# Patient Record
Sex: Female | Born: 1940 | Race: White | Hispanic: No | Marital: Single | Smoking: Never smoker
Health system: Southern US, Community
[De-identification: ages and names within clinical notes are randomized; demographics above are authoritative.]

## PROBLEM LIST (undated history)

## (undated) DIAGNOSIS — I770 Arteriovenous fistula, acquired: Secondary | ICD-10-CM

## (undated) DIAGNOSIS — B2700 Gammaherpesviral mononucleosis without complication: Secondary | ICD-10-CM

## (undated) DIAGNOSIS — I10 Essential (primary) hypertension: Secondary | ICD-10-CM

## (undated) DIAGNOSIS — N186 End stage renal disease: Secondary | ICD-10-CM

## (undated) DIAGNOSIS — T8859XA Other complications of anesthesia, initial encounter: Secondary | ICD-10-CM

## (undated) DIAGNOSIS — T8611 Kidney transplant rejection: Secondary | ICD-10-CM

## (undated) DIAGNOSIS — Z9289 Personal history of other medical treatment: Secondary | ICD-10-CM

## (undated) DIAGNOSIS — M1990 Unspecified osteoarthritis, unspecified site: Secondary | ICD-10-CM

## (undated) DIAGNOSIS — N051 Unspecified nephritic syndrome with focal and segmental glomerular lesions: Secondary | ICD-10-CM

## (undated) DIAGNOSIS — D649 Anemia, unspecified: Secondary | ICD-10-CM

## (undated) DIAGNOSIS — M810 Age-related osteoporosis without current pathological fracture: Secondary | ICD-10-CM

## (undated) DIAGNOSIS — E079 Disorder of thyroid, unspecified: Secondary | ICD-10-CM

## (undated) DIAGNOSIS — E119 Type 2 diabetes mellitus without complications: Secondary | ICD-10-CM

## (undated) DIAGNOSIS — Z940 Kidney transplant status: Secondary | ICD-10-CM

## (undated) DIAGNOSIS — T4145XA Adverse effect of unspecified anesthetic, initial encounter: Secondary | ICD-10-CM

## (undated) DIAGNOSIS — F419 Anxiety disorder, unspecified: Secondary | ICD-10-CM

## (undated) DIAGNOSIS — C801 Malignant (primary) neoplasm, unspecified: Secondary | ICD-10-CM

## (undated) HISTORY — DX: Unspecified nephritic syndrome with focal and segmental glomerular lesions: N05.1

## (undated) HISTORY — DX: Age-related osteoporosis without current pathological fracture: M81.0

## (undated) HISTORY — DX: Arteriovenous fistula, acquired: I77.0

## (undated) HISTORY — DX: End stage renal disease: N18.6

## (undated) HISTORY — DX: Essential (primary) hypertension: I10

## (undated) HISTORY — PX: AV FISTULA PLACEMENT: SHX1204

## (undated) HISTORY — DX: Disorder of thyroid, unspecified: E07.9

## (undated) HISTORY — DX: Type 2 diabetes mellitus without complications: E11.9

## (undated) HISTORY — PX: THYROIDECTOMY: SHX17

## (undated) HISTORY — DX: Kidney transplant status: Z94.0

## (undated) HISTORY — DX: Anemia, unspecified: D64.9

## (undated) HISTORY — DX: Gammaherpesviral mononucleosis without complication: B27.00

## (undated) HISTORY — DX: Unspecified osteoarthritis, unspecified site: M19.90

## (undated) HISTORY — DX: Kidney transplant rejection: T86.11

---

## 2001-04-29 ENCOUNTER — Other Ambulatory Visit: Admission: RE | Admit: 2001-04-29 | Discharge: 2001-04-29 | Payer: Self-pay | Admitting: *Deleted

## 2002-05-13 ENCOUNTER — Encounter: Admission: RE | Admit: 2002-05-13 | Discharge: 2002-05-13 | Payer: Self-pay | Admitting: *Deleted

## 2002-05-13 ENCOUNTER — Encounter: Payer: Self-pay | Admitting: *Deleted

## 2002-05-15 ENCOUNTER — Other Ambulatory Visit: Admission: RE | Admit: 2002-05-15 | Discharge: 2002-05-15 | Payer: Self-pay | Admitting: *Deleted

## 2002-06-08 ENCOUNTER — Encounter: Admission: RE | Admit: 2002-06-08 | Discharge: 2002-06-08 | Payer: Self-pay | Admitting: *Deleted

## 2002-06-08 ENCOUNTER — Encounter: Payer: Self-pay | Admitting: *Deleted

## 2003-05-31 ENCOUNTER — Ambulatory Visit (HOSPITAL_COMMUNITY): Admission: RE | Admit: 2003-05-31 | Discharge: 2003-05-31 | Payer: Self-pay | Admitting: Vascular Surgery

## 2003-06-08 ENCOUNTER — Ambulatory Visit (HOSPITAL_COMMUNITY): Admission: RE | Admit: 2003-06-08 | Discharge: 2003-06-08 | Payer: Self-pay | Admitting: Nephrology

## 2003-06-20 ENCOUNTER — Ambulatory Visit (HOSPITAL_COMMUNITY): Admission: RE | Admit: 2003-06-20 | Discharge: 2003-06-20 | Payer: Self-pay | Admitting: Nephrology

## 2003-10-04 ENCOUNTER — Other Ambulatory Visit: Admission: RE | Admit: 2003-10-04 | Discharge: 2003-10-04 | Payer: Self-pay | Admitting: *Deleted

## 2003-12-10 ENCOUNTER — Encounter (INDEPENDENT_AMBULATORY_CARE_PROVIDER_SITE_OTHER): Payer: Self-pay | Admitting: Specialist

## 2003-12-10 ENCOUNTER — Ambulatory Visit (HOSPITAL_COMMUNITY): Admission: RE | Admit: 2003-12-10 | Discharge: 2003-12-10 | Payer: Self-pay | Admitting: *Deleted

## 2004-01-18 ENCOUNTER — Encounter (INDEPENDENT_AMBULATORY_CARE_PROVIDER_SITE_OTHER): Payer: Self-pay | Admitting: Specialist

## 2004-01-18 ENCOUNTER — Observation Stay (HOSPITAL_COMMUNITY): Admission: RE | Admit: 2004-01-18 | Discharge: 2004-01-19 | Payer: Self-pay | Admitting: *Deleted

## 2004-01-30 HISTORY — PX: KIDNEY TRANSPLANT: SHX239

## 2004-11-27 ENCOUNTER — Inpatient Hospital Stay (HOSPITAL_COMMUNITY): Admission: AD | Admit: 2004-11-27 | Discharge: 2004-12-06 | Payer: Self-pay | Admitting: Nephrology

## 2004-12-01 ENCOUNTER — Encounter (INDEPENDENT_AMBULATORY_CARE_PROVIDER_SITE_OTHER): Payer: Self-pay | Admitting: *Deleted

## 2004-12-01 ENCOUNTER — Ambulatory Visit: Payer: Self-pay | Admitting: Internal Medicine

## 2005-07-23 IMAGING — CR DG CHEST 2V
2 series · 2 of 2 positions shown · non-contrast
Comparison: none

CLINICAL DATA: H?rthle cell neoplasm.  Cough.
 TWO VIEW CHEST RADIOGRAPH   - 01/18/04 
 No prior studies.

[view not recorded (1 of 2)]
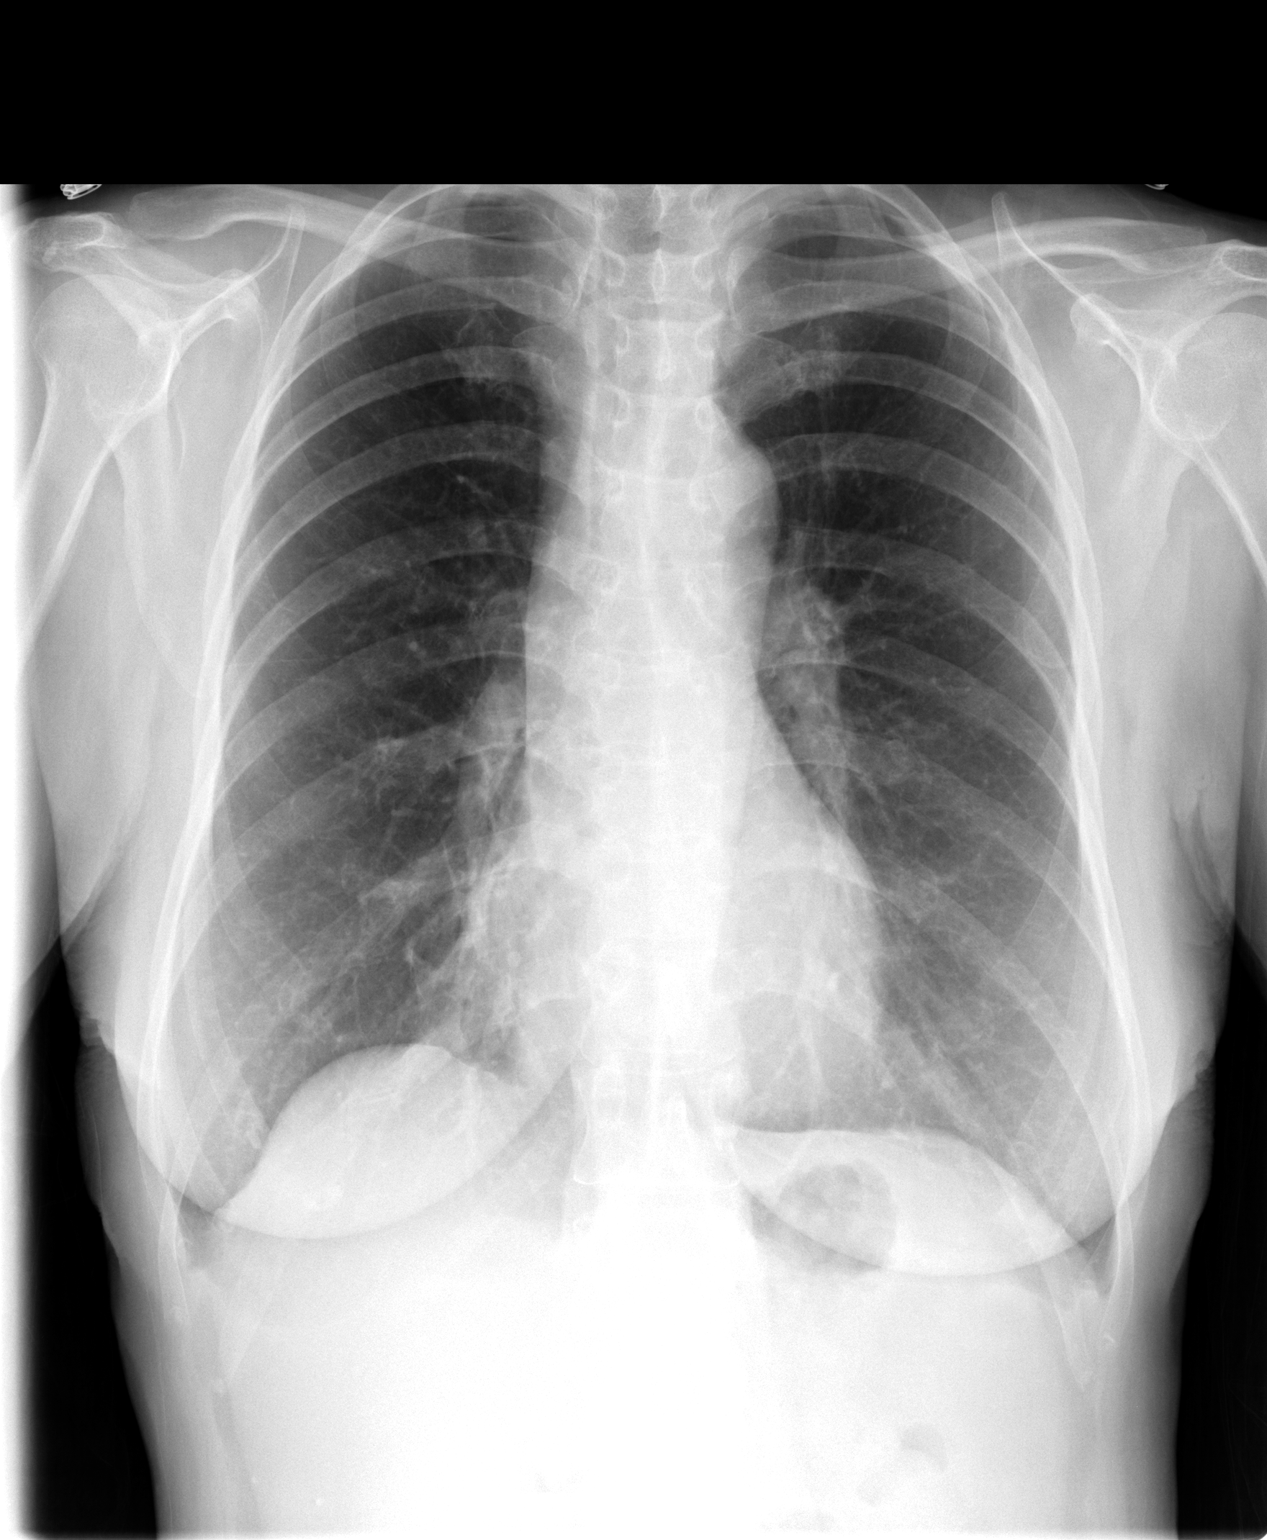

[view not recorded (2 of 2)]
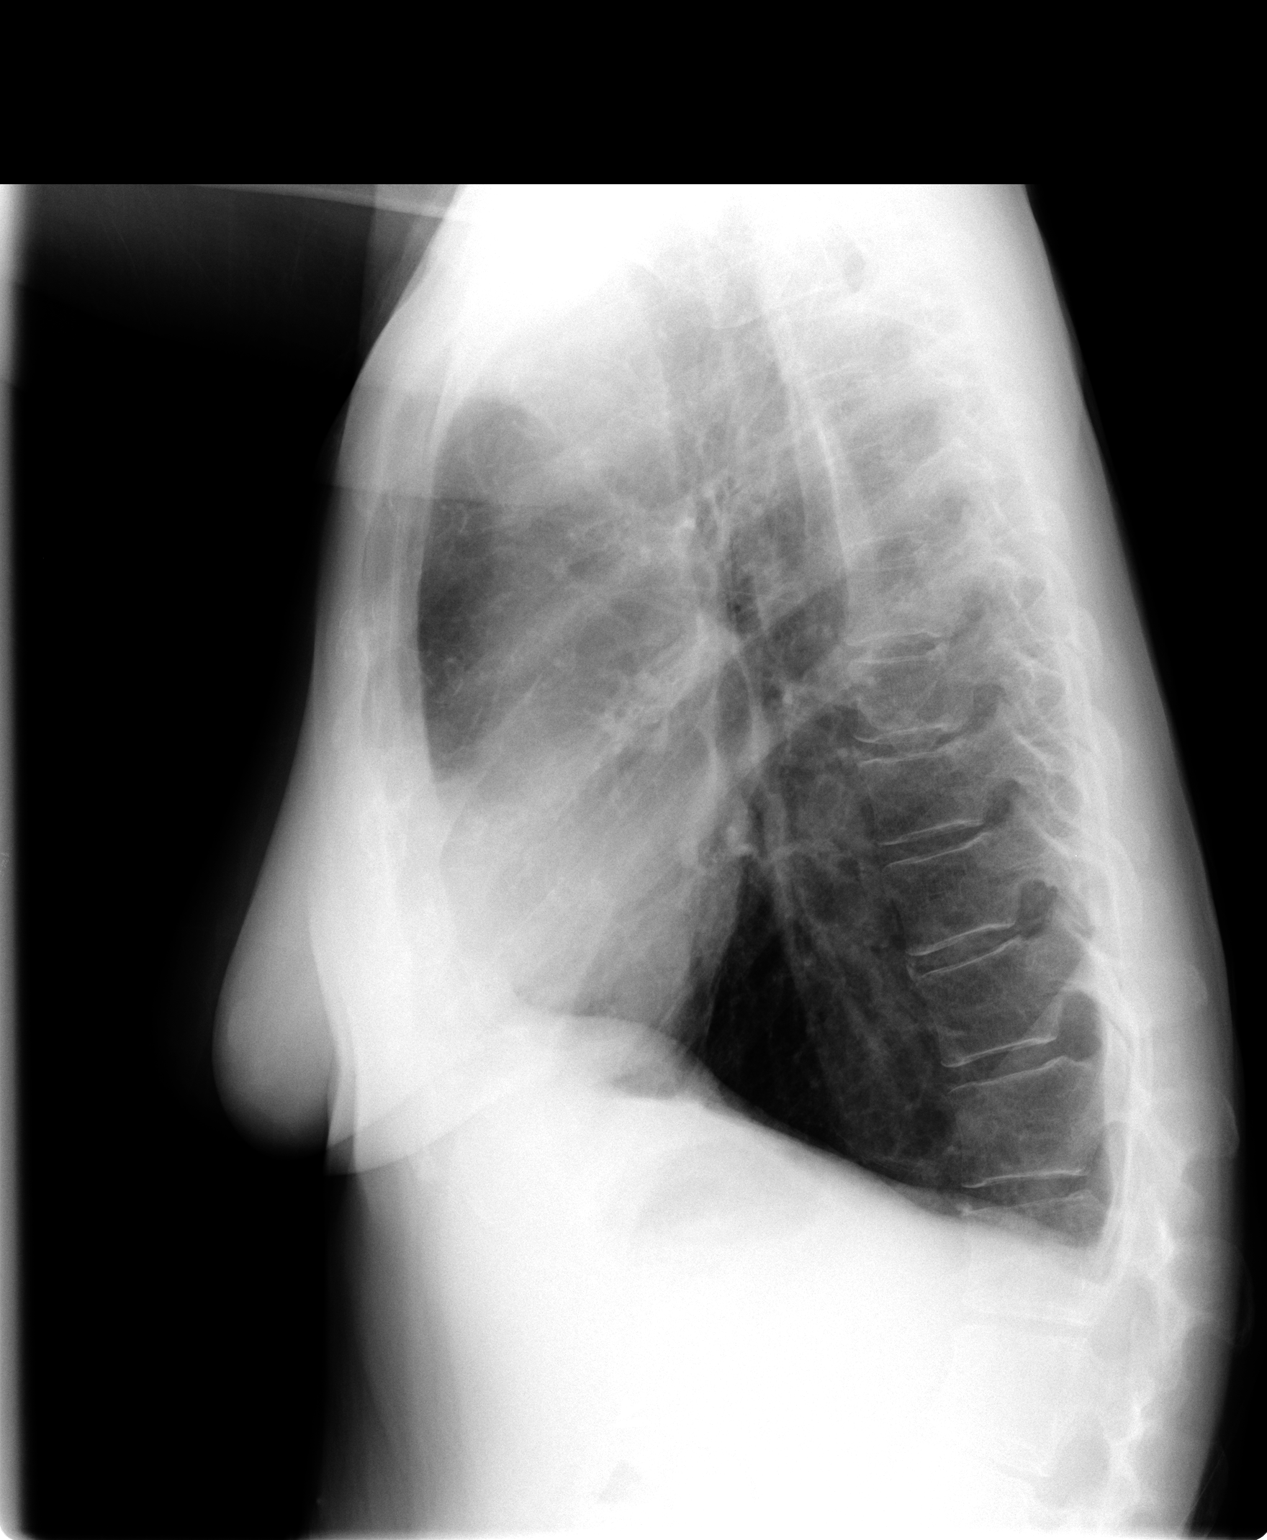

[2 of 2 positions shown; findings below may reference images not displayed]

FINDINGS: No definite goiter is noted.  There is flattening of the hemidiaphragms and tapering of the peripheral pulmonary vasculature suggesting emphysema. The heart and mediastinum appear unremarkable.  The lungs appear clear.
 IMPRESSION
 1.  COPD.

## 2006-06-03 IMAGING — US US ABDOMEN COMPLETE
1 series · 14 of 25 positions shown · non-contrast
Comparison: None

CLINICAL DATA: Status post renal transplant, question perinephric fluid. Status
post cholecystectomy

ABDOMEN ULTRASOUND
TECHNIQUE: Complete abdominal ultrasound examination was performed including
evaluation of the liver, gallbladder, bile ducts, pancreas, kidneys, spleen,
IVC, and abdominal aorta.

[Series 1: unknown · 0.28mm/px · 14 of 56 slices shown]
[im 1/56]
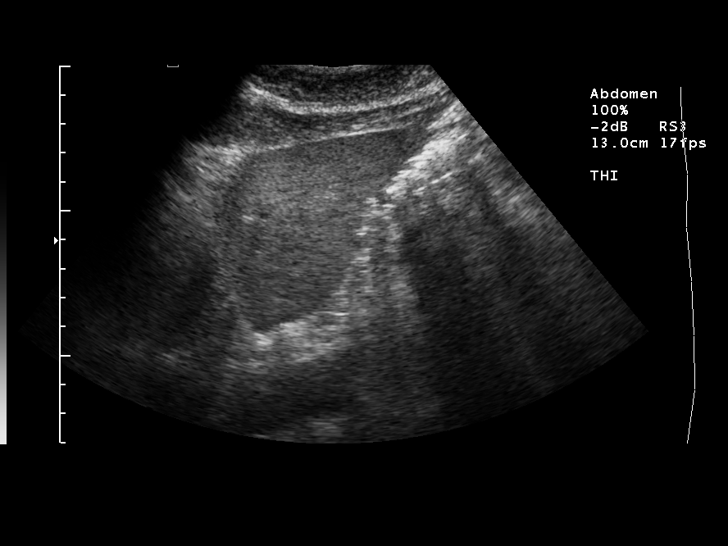
[im 5/56]
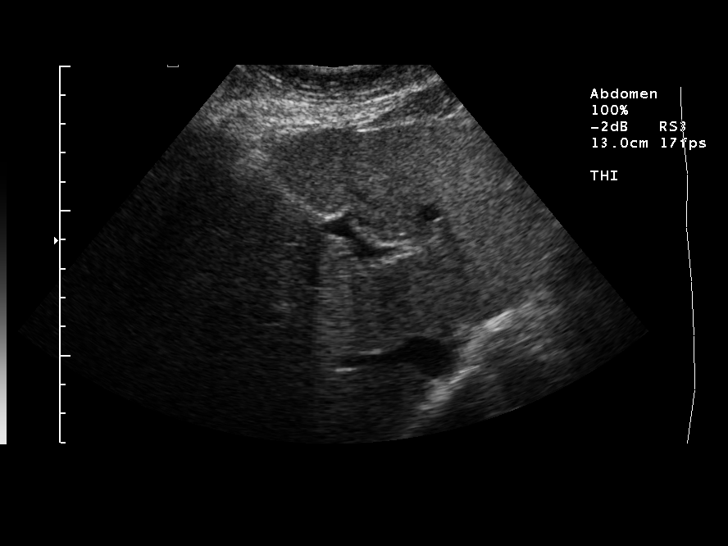
[im 10/56]
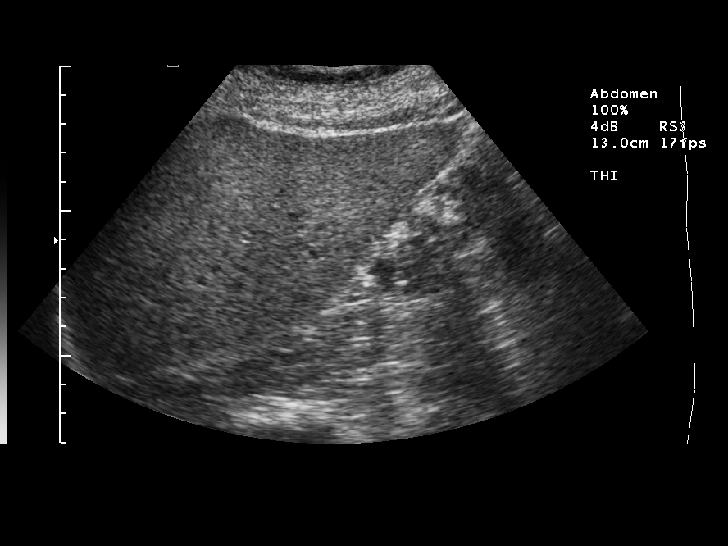
[im 14/56]
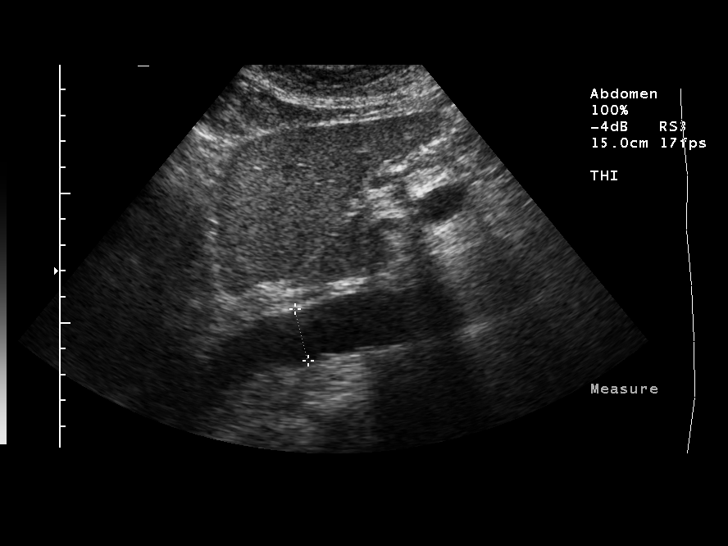
[im 19/56]
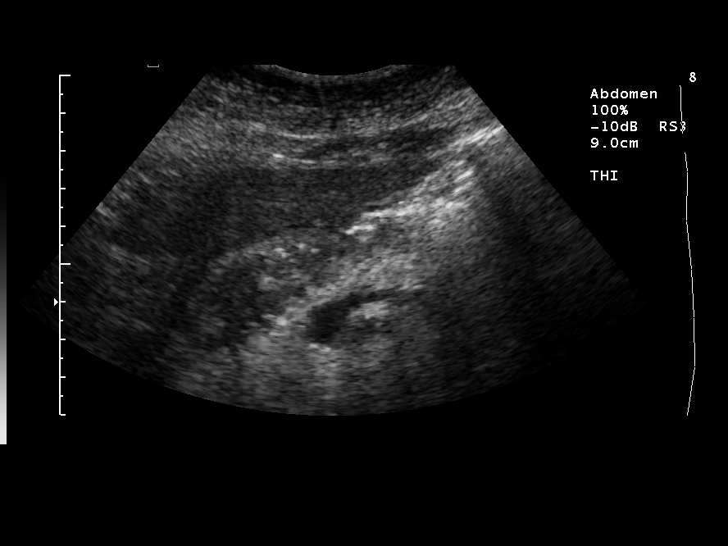
[im 21/56]
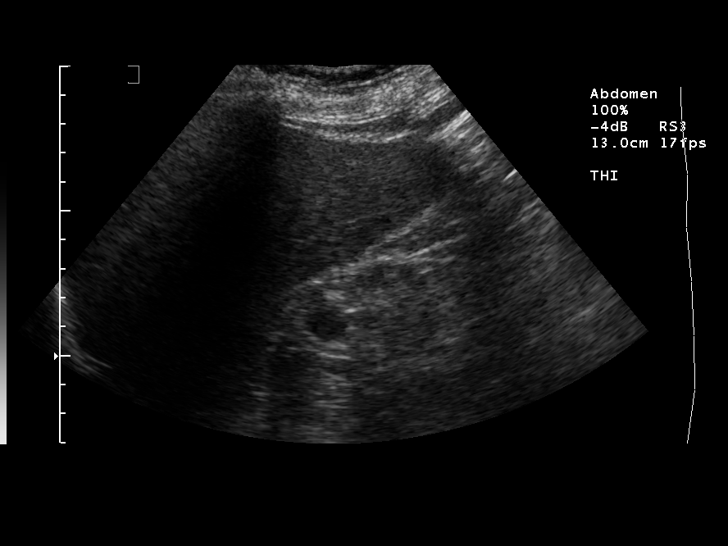
[im 26/56]
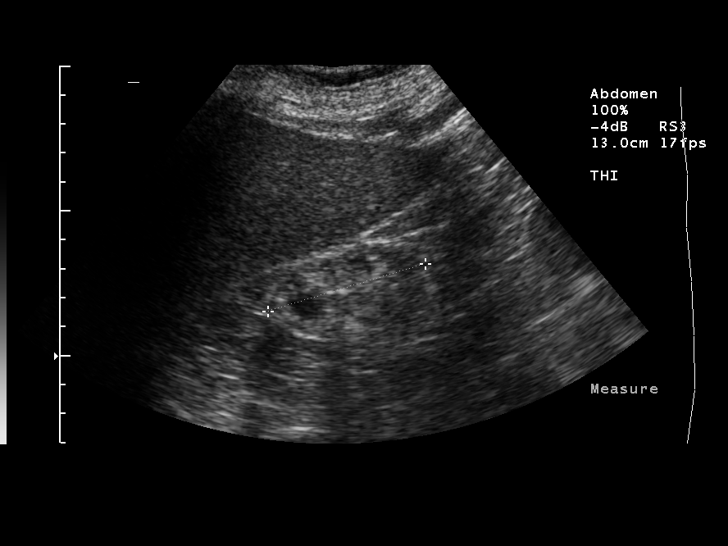
[im 30/56]
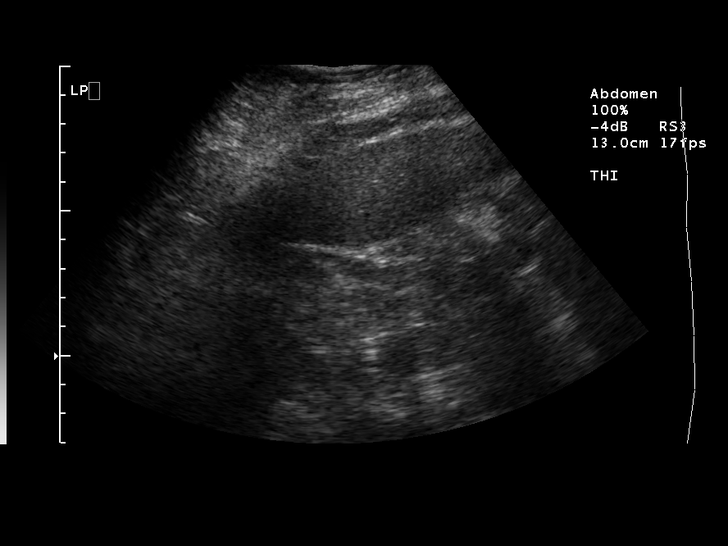
[im 35/56]
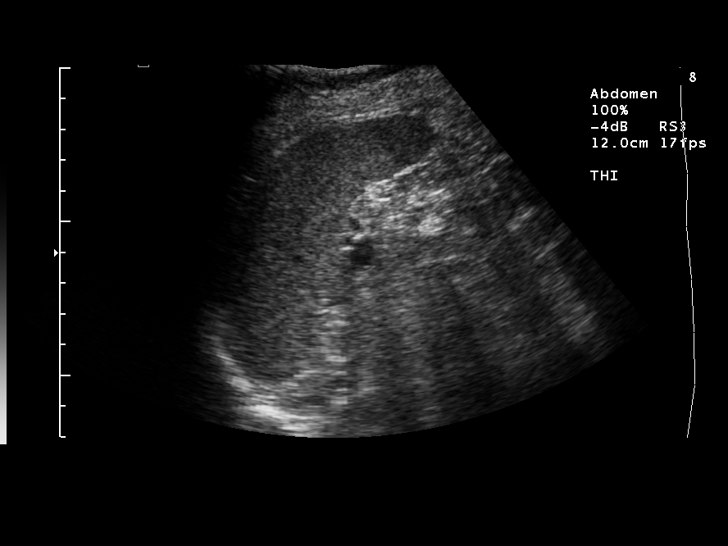
[im 37/56]
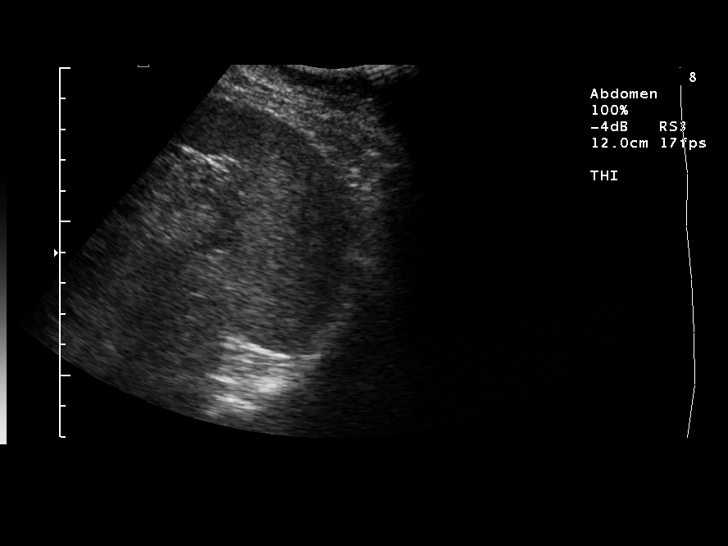
[im 42/56]
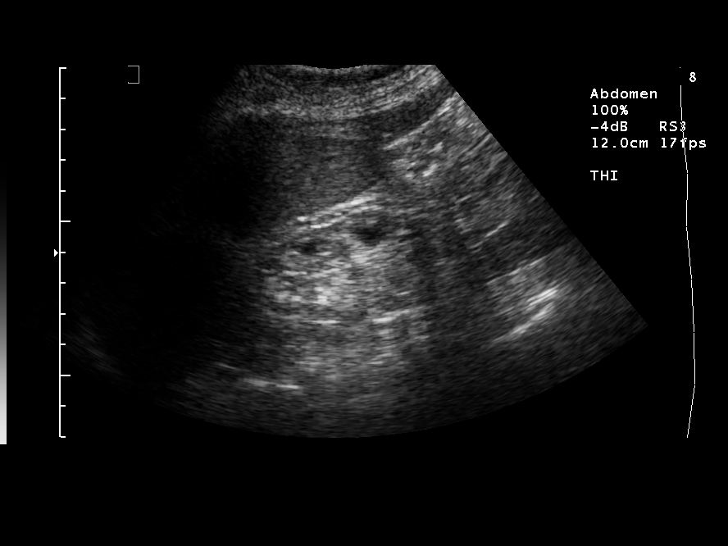
[im 46/56]
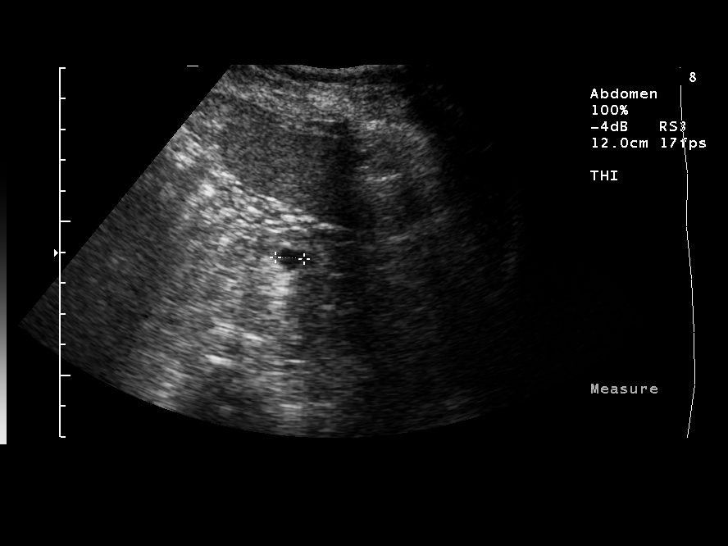
[im 51/56]
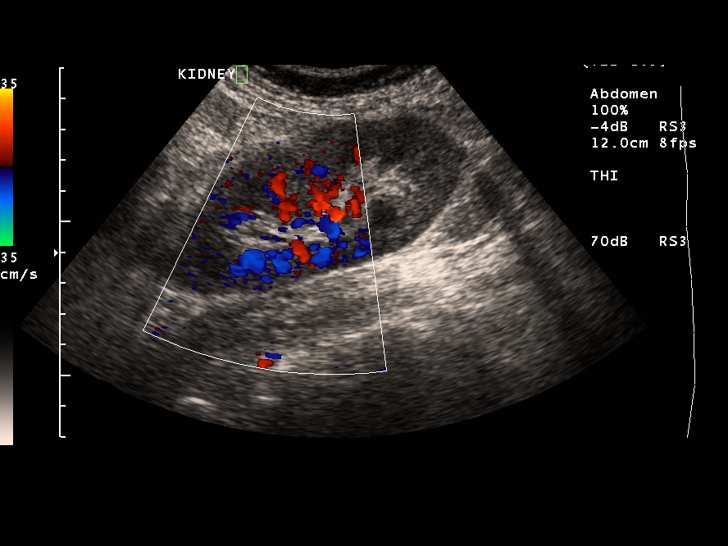
[im 56/56]
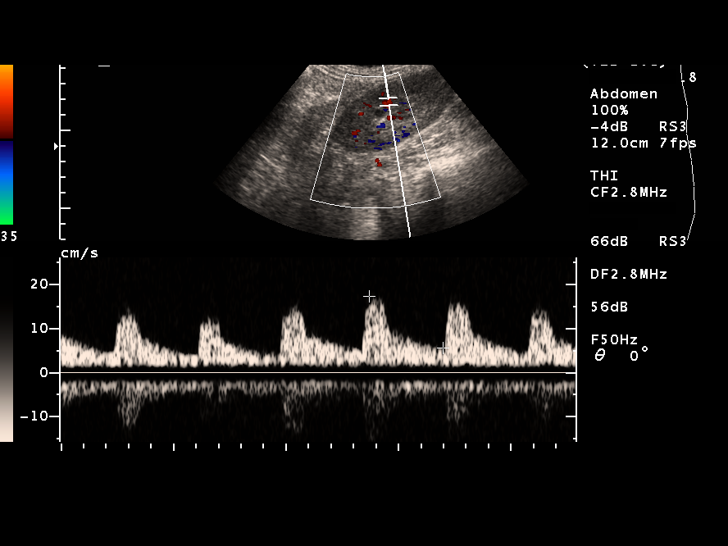

[14 of 25 positions shown; findings below may reference images not displayed]

FINDINGS: Patient status post cholecystectomy. Mild prominence of the common
bile duct at 7 mm, compatible with post cholecystectomy state. Kidneys are small
and echogenic with benign appearing cysts, compatible with patient's history of
end-stage renal disease. The renal transplant is noted in the right lower
quadrant measuring 11.1 cm. No hydronephrosis or perinephric fluid collection.
Resistive index normal at 0.68.

Liver, pancreas, spleen, IVC, and abdominal aorta unremarkable.

IMPRESSION

Unremarkable right lower quadrant renal transplant.

Status post cholecystectomy. 

No acute findings

## 2006-06-04 IMAGING — CT CT ABDOMEN W/O CM
1 series · 15 of 32 positions shown, 19 images · IV contrast (OMNIPAQUE 350)
Comparison: None.

CLINICAL DATA: Dehydration. Nausea, vomiting, diarrhea. Anorexia. History of
kidney transplant.
TECHNIQUE: Multidetector CT imaging of the abdomen and pelvis was performed
following the standard protocol without IV contrast.  Oral contrast was
administered.

[Series 2: routine abdomen · axial · 0.79mm/px · z∈[-479,-59]mm · 15 of 95 slices shown, 19 images]
[im 7/95  soft-tissue]
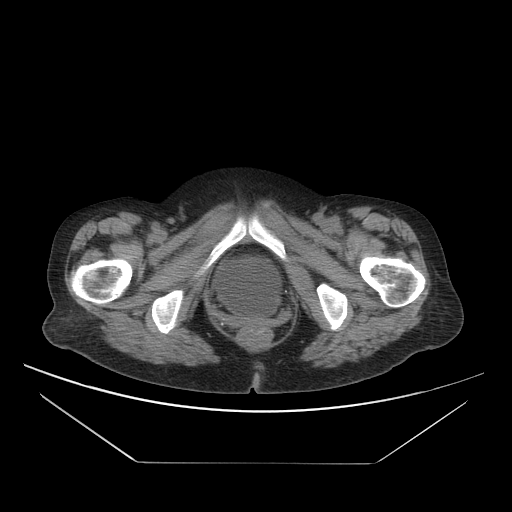
[im 7/95  bone]
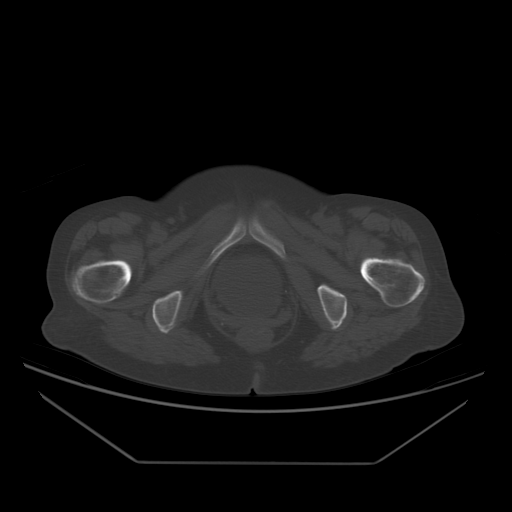
[im 13/95  soft-tissue]
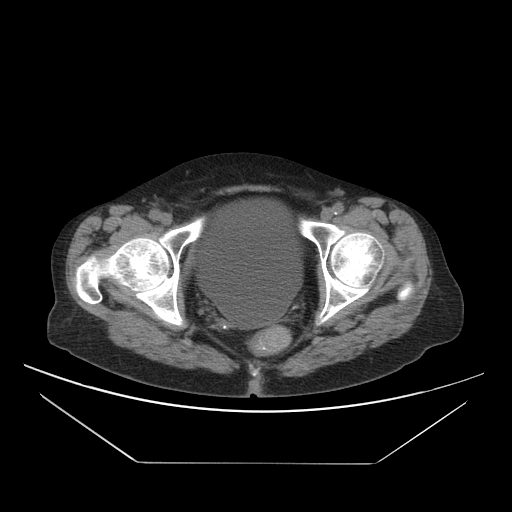
[im 19/95  soft-tissue]
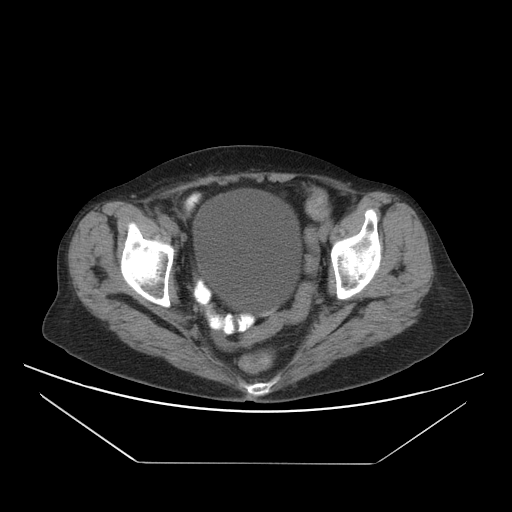
[im 28/95  soft-tissue]
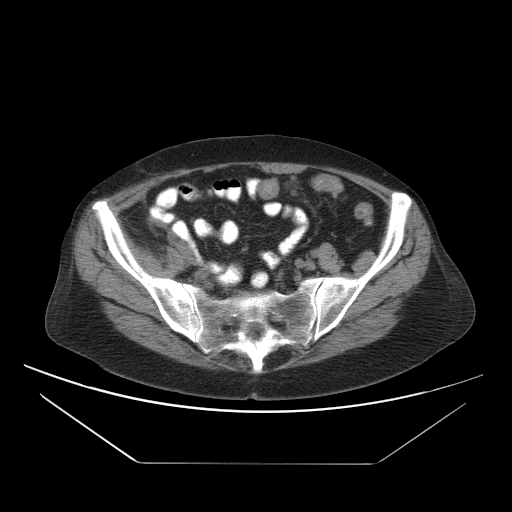
[im 34/95  soft-tissue]
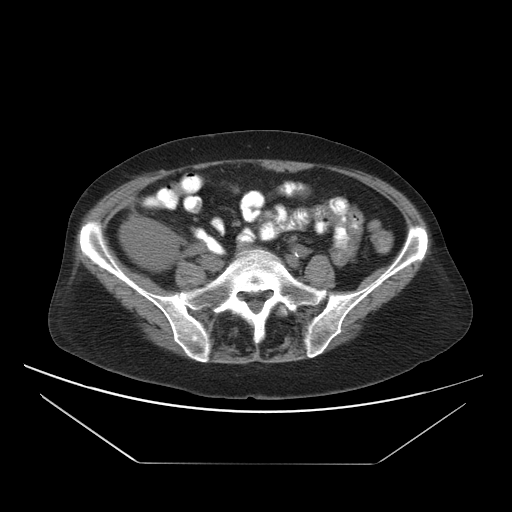
[im 40/95  soft-tissue]
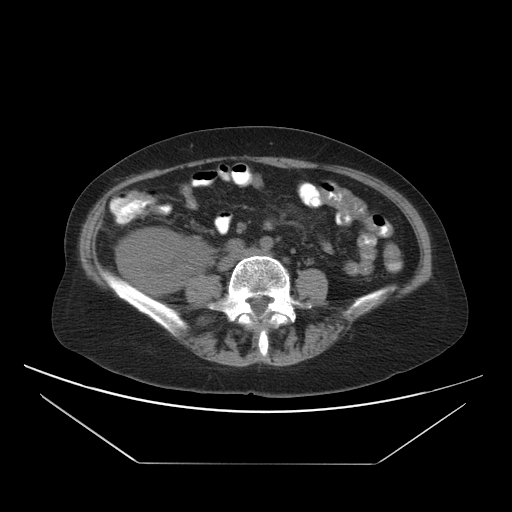
[im 49/95  soft-tissue]
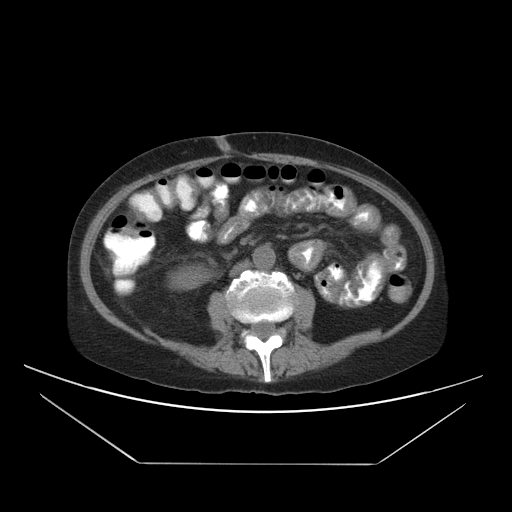
[im 55/95  soft-tissue]
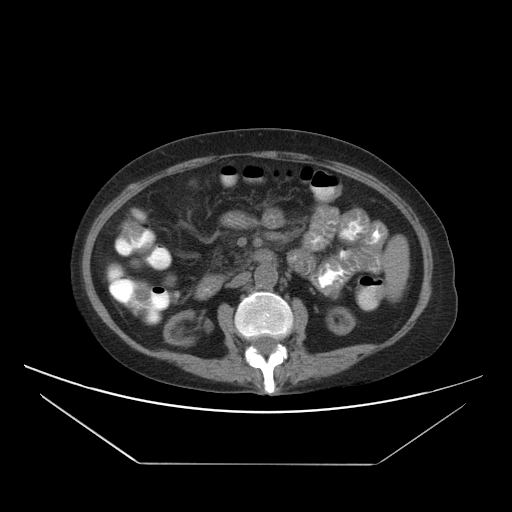
[im 61/95  soft-tissue]
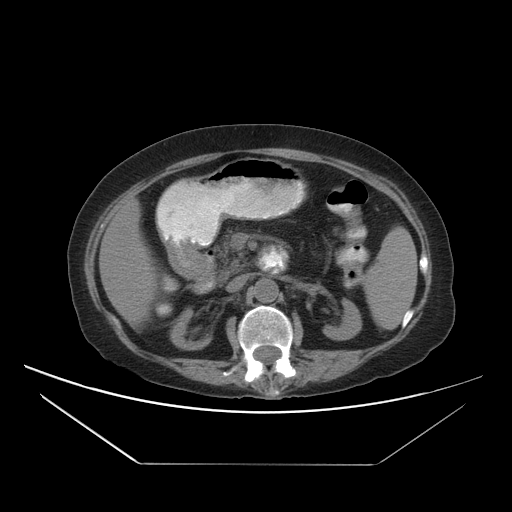
[im 61/95  bone]
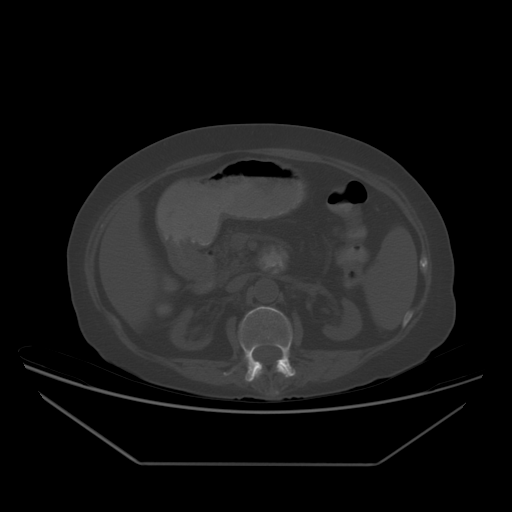
[im 67/95  soft-tissue]
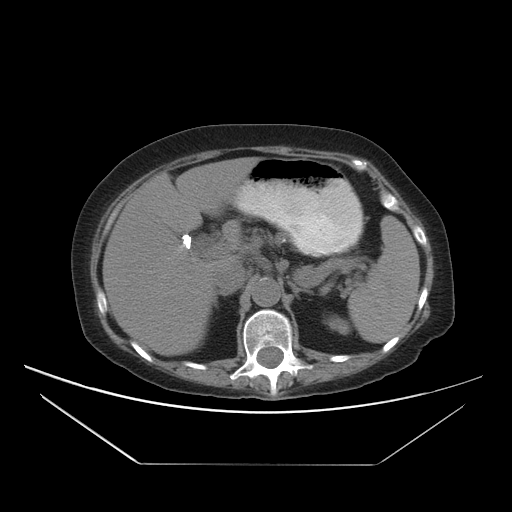
[im 76/95  soft-tissue]
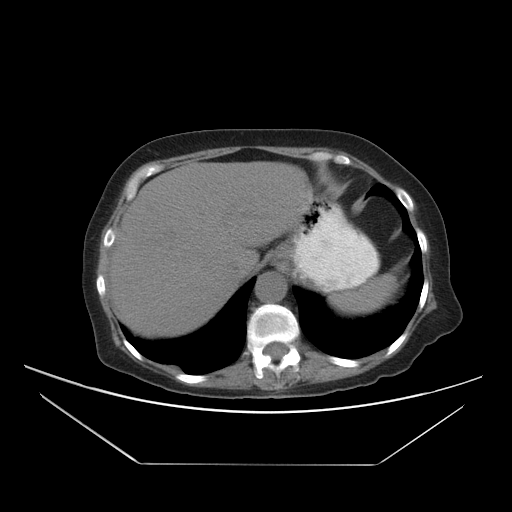
[im 82/95  soft-tissue]
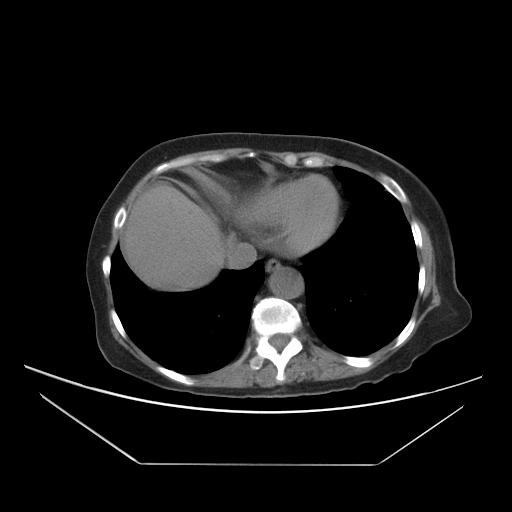
[im 82/95  lung]
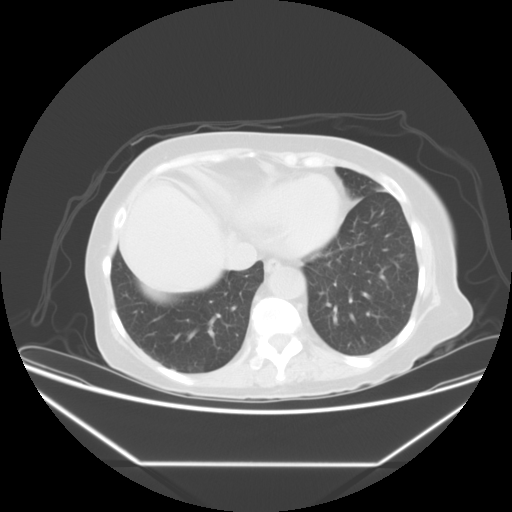
[im 85/95  lung]
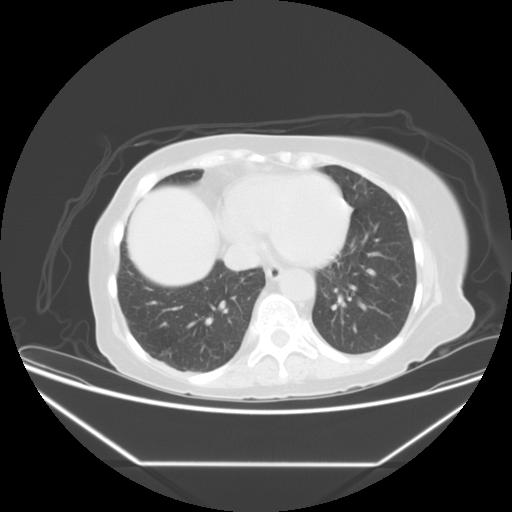
[im 88/95  soft-tissue]
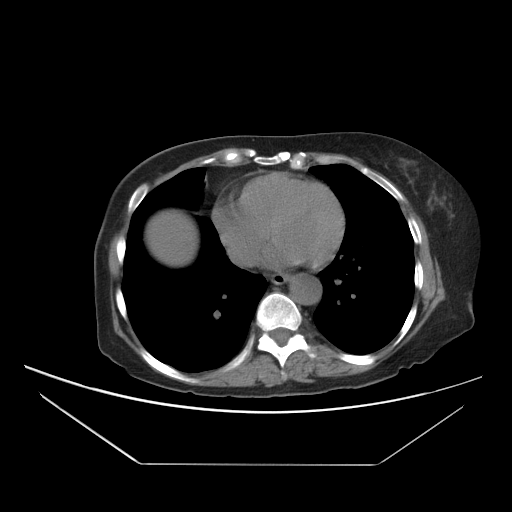
[im 88/95  lung]
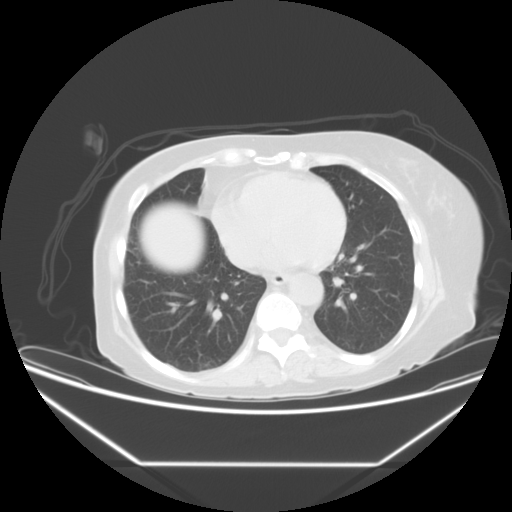
[im 91/95  lung]
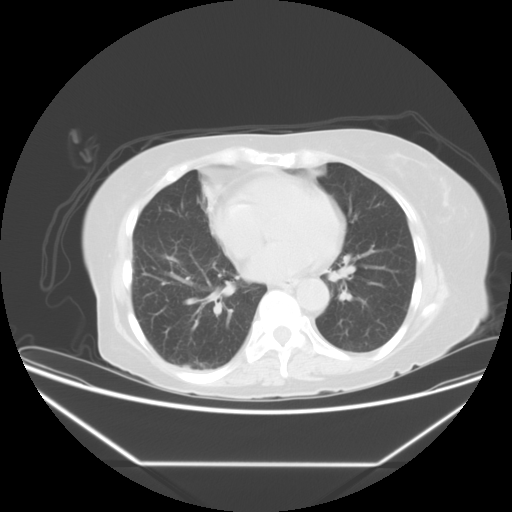

[15 of 32 positions shown; findings below may reference images not displayed]

ABDOMEN CT WITHOUT CONTRAST:

The liver and spleen have normal uninfused appearance. The stomach, duodenum,
and adrenal glands are unremarkable. Status post cholecystectomy. Pancreas is
fatty infiltrated. Bilateral renal atrophy is consistent with the patient's
history of end-stage renal disease. 1.6 cm low-density lesion in the upper pole
of the right kidney is compatible with a cyst. 9 mm hyperattenuating exophytic
focus in the posterior right kidney is noted

Mild but diffuse wall thickening is seen in jejunal loops of the left upper
quadrant. There is no substantial perienteric inflammatory change. Features are
nonobstructing and oral contrast has passed into the colon.
IMPRESSION: Diffuse, but mild wall thickening in the jejunum involving a fairly long segment
of the proximal small bowel, likely to the region of the jejunal ileal junction.
There is no substantial associated mesenteric inflammatory change. Features are
nonspecific. A gastroenteritis could have these features. Clinical correlation
for graft versus host disease may prove helpful.

Tiny high density right renal lesion corresponds to the tiny complex cyst seen
on the MRI scan from 06/20/2003. Is unchanged in the interval.

PELVIS CT WITHOUT CONTRAST:

Urinary bladder is distended in this patient with a right pelvic renal
transplant. Without IV contrast, the transplant right kidney is not well
evaluated but there is no substantial stranding around the transplant kidney and
no focal fluid collection is apparent. Colon is unremarkable. The terminal ileum
have normal features although the appendix is not visualized.
IMPRESSION: No acute findings in the anatomic pelvis.

## 2010-05-09 ENCOUNTER — Ambulatory Visit: Payer: Self-pay | Admitting: Vascular Surgery

## 2010-10-17 NOTE — Consult Note (Signed)
NEW PATIENT CONSULTATION   Gabrielle Gabrielle [name], Gabrielle Gabrielle [name]  DOB:  01-Jul-1940                                       05/09/2010  UJWJX#:[id number]   Patient is Gabrielle [name] 70 year old female with Gabrielle [name] history of renal insufficiency  who underwent renal transplant at [facility] in August 2005.  She  previously had Gabrielle [name] left upper arm fistula created by Dr. Darrick Penna in 2004.  This fistula dilated up nicely and has never been used, and her kidney  transplant continues to function.  Dr. 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  She denies any pain, history of  swelling, infection, or numbness in the left hand.   CHRONIC MEDICAL PROBLEMS:  1. Diabetes.  2. Hypertension.  3. Hyperlipidemia.  4. History of thyroid surgery.  5. History of renal insufficiency with renal transplant.   SOCIAL HISTORY:  The patient is married, has 2 children.  Is retired.  Does not use tobacco or alcohol.   REVIEW OF SYSTEMS:  Positive for headaches, decreased hearing, leg  discomfort with walking and occasionally at rest, arthritis, urinary  frequency and dysuria.  All other systems are negative.   PHYSICAL EXAMINATION:  Blood pressure 127/84, heart rate 88,  respirations 20.  General:  This is Gabrielle [name] well-developed, well-nourished  female in no apparent stress.  Alert and oriented x3.  HEENT:  Normal  for age.  EOMs intact.  Lungs are clear to auscultation.  No rhonchi or  wheezing.  Cardiovascular:  Regular rate and rhythm.  No murmurs.  Abdomen:  Soft, nontender with no palpable masses.  Musculoskeletal:  Free of major deformities.  Neurologic:  Normal.  Skin is free of  rashes.  Upper extremity exam reveals Gabrielle [name] left arm that has 3+ brachial  and 2+ radial pulse palpable.  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  The skin is  quite thin, which allows one to visualize  the underlying fistula more than normal.  There is no evidence of any  focal pseudoaneurysm but generalized dilatation throughout.   I discussed this at length with the patient.  I do not think any  revision is necessary at this time unless this area, particularly over  the arterial anastomosis, enlarges significantly or causes pain.  If  this should occur, she will let us know, and we can revise this fistula,  which has never been utilized.     Quita Skye Hart [place], M.D.  Electronically Signed   JDL/MEDQ  D:  05/09/2010  T:  05/10/2010  Job:  [id number]   cc:   Terrial Rhodes, M.D.

## 2010-10-20 NOTE — H&P (Signed)
NAME:  Gabrielle [name], Gabrielle [name] NO.:  [id number]   MEDICAL RECORD NO.:  [id number]          PATIENT TYPE:  INP   LOCATION:  5006                         FACILITY:  [facility]   PHYSICIAN:  Terrial Rhodes, M.D.DATE OF BIRTH:  Aug 11, 1940   DATE OF ADMISSION:  11/27/2004  DATE OF DISCHARGE:                                HISTORY & PHYSICAL   REASON FOR ADMISSION:  Malaise with nausea and vomiting.   HISTORY OF PRESENT ILLNESS:  Gabrielle [name] is a 70 year old Caucasian  female with a past medical history of cadaver kidney transplant,  hypertension, and secondary hyperparathyroidism that presents today with 1-  1/2 weeks of nausea and vomiting, extreme fatigue, and anorexia.  The  patient was seen on November 24, 2004, by me at [facility] for  the above reasons, diagnosed with viral syndrome, and given Phenergan for  nausea and vomiting.  CBC and CMET at that time only revealed slightly  increasing BUN and creatinine with remainder of labs within normal limits.  She was hypotensive at that time and her Prinivil was held secondary to  that.  Bactrim and Lipitor were held also as questioned contributor to the  nausea and vomiting.  Over the weekend, the patient did not improve and  continued to complain of tiredness and nausea and vomiting, being unable to  keep food or liquids down.  Today, the patient denies fevers, chills,  abdominal pain, hematemesis, chest pain, or shortness of breath.  She does  complain of some intermittent dizziness and dry chronic cough that is  nonproductive.   PAST MEDICAL HISTORY:  1.  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  2.  Hypertension.  3.  Anemia.  4.  DJD.  5.  Secondary hyperparathyroidism.   ALLERGIES:  1.  PENICILLIN.  2.  _____________.   MEDICATIONS:  1.  Prograf 1.5 mg b.i.d.  2.  Prednisone 5 mg daily.  3.  Cellcept 250 mg b.i.d.  4.  Prilosec 20  mg daily.  5.  Synthroid 75 mcg daily.  6.  Trazodone 100 mg daily.  7.  Mag-Oxide 400 mg three b.i.d.  8.  K-Phos 250 mg daily.  9.  Lipitor 10 mg daily.  That has been on hold since November 24, 2004.  10. ___________formula b.i.d.   SOCIAL HISTORY:  The patient lives with her husband.  Currently, she is not  a smoker or an alcohol user.   REVIEW OF SYSTEMS:  Please see HPI, also negative for headache, negative  change in vision, negative urinary dysuria or frequency.   PHYSICAL EXAMINATION:  VITAL SIGNS:  Currently pending.  GENERAL:  Gabrielle [name] is an ill-appearing frail 70 year old female who  is alert and cooperative to questioning.  She is oriented x3, and in no  acute distress.  HEENT:  Head is normocephalic, atraumatic.  Eyes:  Pupils equal, round,  reactive to light and accommodation.  Extraocular movements were intact  bilaterally.  Oral membranes are pale with a dry mucosa.  NECK:  Supple, no JVD.  HEART:  Regular rate and rhythm, S1  and S2 are present, no murmurs or rubs  are auscultated.  LUNGS:  Clear to auscultation bilaterally.  No wheezes, rhonchi, or rales.  ABDOMEN:  Thin, positive bowel sounds x4.  Abdomen is soft and nontender in  four quadrants.  There is no rebound tenderness.  No tenderness over the  cadaver kidney.  EXTREMITIES:  Negative for bilateral lower extremity edema.   LABORATORY DATA:  Currently pending.   ASSESSMENT AND PLAN:  1.  Nausea, vomiting, diarrhea, and malaise.  Question gastroenteritis      (viral), CMV, colitis, (less likely), pancreatitis versus small bowel      obstruction versus transplant rejection.  To admit patient, start      intravenous fluids and anti-emetics.  Hold Cellcept and hold Lisinopril      and Lipitor.  We will also check an abdominal ultrasound and evaluate      kidney transplant.  We will check an abdominal series to rule out small      bowel obstruction and send stool for Clostridium difficile for ovum and       parasites and culture and sensitivity.  We will also check CMV PCR.  2.  Hypertension.  The patient is currently hypotensive secondary to volume      depletion.  We will continue intravenous filtration.  3.  Renal transplant.  The patient will continue her prednisone and Prograf.      We will check Prograf levels.  We will follow her serum creatinine.  4.  Hypothyroidism.  The patient will continue her Synthroid and check TSH.  5.  Dyslipidemia.  Hold Lipitor secondary to nausea and vomiting.  6.  Anemia.  Check CBC.      Mi   MY/MEDQ  D:  11/27/2004  T:  11/27/2004  Job:  [id number]

## 2010-10-20 NOTE — Op Note (Signed)
NAME:  Gabrielle [name], Gabrielle [name]                 ACCOUNT NO.:  [id number]   MEDICAL RECORD NO.:  [id number]                   PATIENT TYPE:  OBV   LOCATION:  0440                                 FACILITY:  [facility]   PHYSICIAN:  Vikki Ports, M.D.         DATE OF BIRTH:  09/21/1940   DATE OF PROCEDURE:  01/18/2004  DATE OF DISCHARGE:                                 OPERATIVE REPORT   PREOPERATIVE DIAGNOSES:  1. Left thyroid nodule, Hurthle cell neoplasm.  2. Hypoparathyroidism.   PROCEDURE:  Left completion thyroid lobectomy and excision of parathyroid  adenoma.   SURGEON:  Vikki Ports, M.D.   ASSISTANT:  Thornton Park. Daphine Deutscher, M.D.   ANESTHESIA:  General.   DESCRIPTION OF PROCEDURE:  The patient was taken to the operating room and  placed in the supine position and after adequate general anesthesia was  induced using an endotracheal tube, the neck was extended and was prepped  and draped in the normal sterile fashion.  Using a low collar transverse  incision, I dissected down to the subcutaneous tissue to the platysma  muscle. The platysma muscle was divided and flaps were created superiorly  and inferiorly down to the sternal notch. Strap muscles were opened in the  midline and the strap muscles were taken off the left thyroid lobe.  The  middle thyroid vein was identified, clipped and divided. The superior pole  in which the nodule lie was mobilized and it was divided using medial to  lateral technique, ligated using 2-0 silk sutures and divided. The thyroid  gland was then mobilized off the trachea taking great care to identify  recurrent laryngeal nerve and preserve it. The remainder of the thyroid lobe  was mobilized medially. During this dissection, a small but obvious  parathyroid adenoma was identified and excised and sent for frozen section  which confirmed the presence of a parathyroid adenoma.  The thyroid was sent  for permanent pathologic evaluation,  adequate hemostasis was ensured. The  strap muscles were closed with a running 3-0 Vicryl suture, the platysma was  closed with interrupted 4-0 Vicryl sutures, skin was closed with staples. A  sterile dressing was applied, the patient tolerated the procedure well and  went to PACU in good condition.                                               Vikki Ports, M.D.    KRH/MEDQ  D:  01/18/2004  T:  01/19/2004  Job:  [id number]

## 2010-10-20 NOTE — Op Note (Signed)
NAME:  Gabrielle [name], Gabrielle [name]                 ACCOUNT NO.:  [id number]   MEDICAL RECORD NO.:  [id number]                   PATIENT TYPE:  OIB   LOCATION:  2899                                 FACILITY:  [facility]   PHYSICIAN:  Janetta Hora. Fields, MD               DATE OF BIRTH:  Nov 30, 1940   DATE OF PROCEDURE:  05/31/2003  DATE OF DISCHARGE:  05/31/2003                                 OPERATIVE REPORT   PROCEDURE:  Left brachiocephalic fistula.   PREOPERATIVE DIAGNOSIS:  End-stage renal disease.   POSTOPERATIVE DIAGNOSIS:  End-stage renal disease.   ANESTHESIA:  Local with IV sedation.   ASSISTANT:  Jerold Coombe, P.A.   INDICATIONS:  The patient is a 70 year old female with end-stage renal  disease who requires long-term hemodialysis access.   FINDINGS:  1. A 3-mm cephalic vein.  2. A 4-mm brachial artery.   OPERATIVE DETAILS:  After obtaining informed consent, the patient was taken  to the operating room.  The patient was placed in the supine position on the  operating table.  Next, the patient's entire left upper extremity was  prepped and draped in the usual fashion.  Local anesthesia was infiltrated  just below the antecubital crease.  An incision was made in this area and  carried down through the subcutaneous tissues and the cephalic vein was  located and dissected free circumferentially for approximately 5 cm.  This  was of good quality, although thin walled and was approximately 3 mm in  diameter.  Next, the brachial artery was dissected free circumferentially on  the medial aspect of the arm.  This was dissected free circumferentially and  controlled proximally and distally with vessel loops.  Next, the distal  cephalic vein was ligated with a 3-0 silk tie in the distal arm and  mobilized so that it swung over easily to the brachial artery.  The patient  was then given 5000 units of intravenous heparin.  Vertical arteriotomy was  made and the vein was sewn in the  vein to side of artery using a running 7-0  Prolene suture.  Prior to completion of the anastomosis, the usual flushing  and backbleeding procedures were performed.  The clamps were then removed  and the graft was allowed to fill and there was a palpable thrill up the arm  immediately.  Next, the subcutaneous tissues were reapproximated using a  running 3-0 Vicryl suture.  The skin was then closed with a 4-0 Vicryl  subcuticular stitch.  The patient tolerated the procedure well and there  were no complications.  Instrument, sponge, and needle count was correct at  the end of the case.  The patient was taken to the recovery room in stable  condition.  Janetta Hora. Darrick Penna, MD    CEF/MEDQ  D:  06/04/2003  T:  06/04/2003  Job:  [id number]

## 2010-10-20 NOTE — Op Note (Signed)
NAME:  Gabrielle [name], Gabrielle [name]                 ACCOUNT NO.:  [id number]   MEDICAL RECORD NO.:  [id number]                   PATIENT TYPE:  OIB   LOCATION:  2899                                 FACILITY:  [facility]   PHYSICIAN:  Janetta Hora. Fields, MD               DATE OF BIRTH:  08-Apr-1941   DATE OF PROCEDURE:  05/31/2003  DATE OF DISCHARGE:  05/31/2003                                 OPERATIVE REPORT   PROCEDURE:  Evacuation of hematoma, left arm.   PREOPERATIVE DIAGNOSIS:  Hematoma, left arm, status post fistula creation.   POSTOPERATIVE DIAGNOSIS:  Hematoma, left arm, status post fistula creation.   ANESTHESIA:  Local with IV sedation.   ASSISTANT:  Nurse.   INDICATIONS:  The patient is a 70 year old female who had a brachiocephalic  fistula placed earlier today who now has a large hematoma in her antecubital  region.   OPERATIVE FINDINGS:  Small area of anastomotic bleeding.   OPERATIVE DETAILS:  After obtaining informed consent, the patient was taken  to the operating room.  The patient's left upper extremity was prepped and  draped in the usual sterile fashion.  Local anesthesia was infiltrated in  the antecubital incision.  This was reopened and hematoma was evacuated.  There was a small area of bleeding from the arterial anastomosis.  This was  repaired with a 7-0 Prolene.  The wound was then thoroughly irrigated with  normal saline solution.  There was still noted to be a palpable thrill  within the graft.  The patient tolerated the procedure well and there were  no complications.  The subcutaneous tissues were then reapproximated with 3-  0 Vicryl suture.  The skin was then closed with a 4-0 Vicryl subcuticular  stitch.  Instrument, sponge, and needle count was correct at the end of the  case.  The patient was taken to the recovery room in stable condition.                                               Janetta Hora. Darrick Penna, MD    CEF/MEDQ  D:  06/04/2003  T:  06/04/2003   Job:  [id number]

## 2010-10-20 NOTE — Consult Note (Signed)
NAME:  Gabrielle [name], Gabrielle [name]       ACCOUNT NO.:  [id number]   MEDICAL RECORD NO.:  [id number]          PATIENT TYPE:  INP   LOCATION:  5532                         FACILITY:  [facility]   PHYSICIAN:  John C. Madilyn Fireman, M.D.    DATE OF BIRTH:  18-Jul-1940   DATE OF CONSULTATION:  11/29/2004  DATE OF DISCHARGE:                                   CONSULTATION   REASON FOR CONSULTATION:  Diarrhea, nausea, fatigue, and anorexia.   HISTORY OF PRESENT ILLNESS:  Patient is a 70 year old white female who  presents with the above symptoms of approximately one-week duration.  She  has not had any significant pain, fever, or rectal bleeding, although she  did say her last bowel movement looked dark for the first time today.  She  describes watery diarrhea several times a day with no incontinence.  She  denies any recent antibiotics except that she has been on Bactrim  prophylactically since her kidney transplant.  She denies any recent travel,  sick household contacts, or suspicious p.o. intake.  She had had some  vomiting once or twice since the onset but this has not persisted.  She has  multiple stool studies pending including a C. difficile toxin, ova and  parasites, and stool culture.  She had a gallbladder ultrasound which was  essentially unrevealing except for post cholecystectomy state.  KUB showed a  few gas filled nondilated bowel loops.  CMV, PCR, and cortisol level are all  pending.  CT of abdomen and peripheral smear analysis due to pancytopenia  has been ordered.   PAST MEDICAL HISTORY:  1.  End-stage renal disease status post cadaver kidney transplant 2005.  2.  Hypertension.  3.  Anemia.  4.  Degenerative joint disease.  5.  Secondary hyperparathyroidism.   ALLERGIES:  PENICILLIN, SUCCINYLCHOLINE.   MEDICATIONS:  1.  Prograf.  2.  Prednisone.  3.  CellCept.  4.  Prilosec.  5.  Synthroid.  6.  Trazodone.  7.  Magnesium oxide.  8.  Potassium.  9.  Phosphorous.  10.  Lipitor.   SOCIAL HISTORY:  Patient lives with her husband.  She denies alcohol or  tobacco use.   PHYSICAL EXAMINATION:  GENERAL:  Somewhat chronically ill, frail-appearing  white female in no acute distress.  HEART:  Regular rate and rhythm without murmur.  LUNGS:  Clear.  ABDOMEN:  Soft, nondistended with normoactive bowel sounds.  No  hepatosplenomegaly, mass, or guarding.   LABORATORIES:  Liver function tests are normal.  Hemoglobin 9.6, hematocrit  27.4, platelets 91,000, WBC 3300, MCV 97.9.  Iron level normal.  TSH  slightly low.  Calcium 8, phosphorous 1.7, potassium 2.9.   IMPRESSION:  Nausea, anorexia, malaise, diarrhea in an immunocompromised  patient status post kidney transplant.   PLAN:  With leading suspicions being an infectious diarrhea, await all stool  studies and will obtain Cryptosporidial and Giardia antigen.  Will also heme  test stools due to reported dark stools today and will obtain a full  hyperthyroid panel given her slightly low TSH.  Will also await abdominal CT  scan and CMV, PCR which are all pending.  JCH/MEDQ  D:  11/29/2004  T:  11/29/2004  Job:  [id number]

## 2010-10-20 NOTE — Discharge Summary (Signed)
NAME:  Gabrielle [name], MELNIK NO.:  [id number]   MEDICAL RECORD NO.:  [id number]          PATIENT TYPE:  INP   LOCATION:  5532                         FACILITY:  [facility]   PHYSICIAN:  Terrial Rhodes, M.D.DATE OF BIRTH:  [birth year]   DATE OF ADMISSION:  11/27/2004  DATE OF DISCHARGE:  12/06/2004                                 DISCHARGE SUMMARY   DISCHARGE DIAGNOSES:  1.  Cytomegalovirus.  2.  Pancytopenia, resolved.  3.  Status post renal transplant.  4.  Hypertension.   PROCEDURES:  1.  Abdominal ultrasound November 28, 2004:  Impression is unremarkable right      lower quadrant renal transplant; status post cholecystectomy; no acute      findings.  2.  November 29, 2004, abdomen CT without contrast: Impression diffuse, but mild      wall thickening in the jejunum involving a fairly long segment of the      proximal small bowel, likely to the region of the jejunal ileal      junction. There is no substantial associated mesenteric inflammatory      change. Features are nonspecific. A gastroenteritis could have these      features. Clinical correlation for graft-versus-host disease may prove      helpful.  3.  December 01, 2004, bone marrow biopsy by Dr. Arbutus Ped.   CONSULTATIONS:  1.  Dr. Dorena Cookey.  2.  Dr. Velora Heckler. Mohamed.   HISTORY OF PRESENT ILLNESS:  Gabrielle [name] is a 70 year old Caucasian  female with past medical history of cadaveric kidney transplant,  hypertension, and secondary hyperparathyroidism that presents today with 1  to 1-1/2 week of nausea and vomiting, extreme fatigue, and anorexia. The  patient was seen on November 24, 2004 by [facility] for the  above reasons, diagnosed with viral syndrome, and given Phenergan for nausea  and vomiting. CBC and CMET at that time only revealed slightly increased BUN  and creatinine with remainder of labs within normal limits. She was  hypotensive at that time and her Prinivil was held secondary to  that.  Bactrim and Lipitor were held also a question contributor to the nausea and  vomiting. Over the weekend, the patient did not improve and continued to  complain of tiredness, nausea and vomiting, and unable to keep food or  liquids down. Today the patient's fever, chills, abdominal pain,  hematemesis, chest pain, shortness of breath. She does complain of some  intermittent dizziness and dry chronic cough that is not productive.   ADMISSION LABS:  WBC 4.2, hemoglobin 11.5, hematocrit 32.7, platelets  111,000. Sodium 132, potassium 3.7, chloride 92, CO2 29, glucose 104, BUN  33, creatinine 1.4, and albumin 3.4.   Next admission acute abdominal series: Impression no acute cardiopulmonary  disease, negative for free intraperitoneal air cell obstruction, tiny  calcification just inferior to L4. Question right ureteral stone.   HOSPITAL COURSE:  Problem 1:  CYTOMEGALOVIRUS:  The patient was admitted and  differential diagnosis included gastroenteritis, colitis or pancreatitis  versus small-bowel obstruction versus transplant rejection. Secondary to  fevers, a full workup was done to include chest x-ray, urinalysis, and stool  cultures which were all negative for source. CMV titer was drawn secondary  to transplant status and the patient also received a transplant CMV positive  kidney. This was positive. The patient was started on empiric ganciclovir on  November 30, 2004 secondary to findings noted on the CT abdomen with the results  above. A CMV was positive. The patient also received a GI consult on November 29, 2004 secondary to the patient's symptoms of fever, malaise and diarrhea.  Dr. Madilyn Fireman did consult and stool cultures were obtained again which were  negative. Guaiac stools were performed and were negative. A full  hyperthyroid panel was performed given her slightly low TSH. Secondary to  above findings of CMV, Dr. Madilyn Fireman did sign off and will be available as  needed.   The patient  began to have resolution of symptoms with the ganciclovir and  was changed over to [facility] upon discharge. (See discharge medications.)   Problem 2:  PANCYTOPENIA:  The patient was noted to have worsening  pancytopenia on her day of admission. Her white blood cell count was 4.2  which decreased to 3.3 on November 29, 2004. Her hemoglobin dropped below 11.5  on November 27, 2004 to 9.6 on November 29, 2004 and a platelet count went from  111,000 on November 27, 2004 to 91 on November 29, 2004. Dr. Arbutus Ped was consulted  and felt that the mild pancytopenia was more than likely related to drug-  induced versus viral-induced but at the same time and a bone marrow biopsy  was performed on December 01, 2004 and revealed normal findings. The patient  will follow up with Dr. Arbutus Ped as needed.   Problem 3:  STATUS POST RENAL TRANSPLANT:  The patient's creatinine remained  stable through this admission. She did remain on her CellCept, Prograf and  prednisone. There were no further interventions.   Problem 4:  HYPERTENSION:  The patient's blood pressure remained very stable  off her ACE inhibitor. She did not require anything at discharge.   DISCHARGE MEDICATIONS:  1.  Prograf 1.5 milligrams b.i.d.  2.  Prednisone 5 milligrams q.d.  3.  CellCept 250 b.i.d.  4.  Prilosec 20 mg q.d.  5.  Synthroid 75 mcg q.d.  6.  Trazodone 100 mg q.d.  7.  Mag-Ox 400 mg 3 b.i.d.  8.  Lipitor 10 milligrams q.d.  9.  Valcyte 900 milligrams p.o. q.d. until December 15, 2004 then dose will      decrease to 450 mg p.o. q.d. for 3 months.   DISCHARGE INSTRUCTIONS:  The patient's medications were reviewed upon  discharge. She will follow up with Dr. Terrial Rhodes in approximately 10  days to 2 weeks. If there are any questions or concerns, the patient will  call [facility].      Azucena Fallen, PA    ______________________________  Terrial Rhodes, M.D.   MY/MEDQ  D:  02/07/2005  T:  02/07/2005  Job:   [id number]   cc:   [facility]   Everardo All. Madilyn Fireman, M.D.  [address]  [place]  [place] [address]  Fax: [phone]   Lajuana Matte, MD  Fax: [phone]

## 2010-10-20 NOTE — Consult Note (Signed)
NAME:  Gabrielle [name], Gabrielle [name] NO.:  [id number]   MEDICAL RECORD NO.:  [id number]          PATIENT TYPE:  INP   LOCATION:  5532                         FACILITY:  [facility]   PHYSICIAN:  Lajuana Matte, MD  DATE OF BIRTH:  1941-04-01   DATE OF CONSULTATION:  11/29/2004  DATE OF DISCHARGE:                                   CONSULTATION   REASON FOR CONSULTATION:  70 year old white female with pancytopenia.   HISTORY:  Ms. Gabrielle [name] is a very pleasant 70 year old white female with  past medical history significant for end stage renal disease status post  cadaveric renal transplant in August 2005.  The patient also has a history  of hypertension and anemia as well as secondary hyperparathyroidism, who was  doing very well after her renal transplant until a few days ago when she  presented to the nephrology clinic complaining of increasing fatigue,  weakness, vomiting, and diarrhea.  The patient was admitted to [facility] for surgical evaluation, had several studies performed including  stool for difficile toxin, ovum and parasite, as well as stool culture and  PCR for CMV.  The patient was noted to have worsening pancytopenia, on the  day of admission, her white blood count was 4.2 which decreased to 3.3 on  November 29, 2004.  Her hemoglobin dropped from 11.5 on June 26 to 9.6 on June  28 and the platelet count went from 111,000 on June 26 to 91 on June 28.  The patient has been on several immunosuppressive medications after her  renal transplant including Cellcept and Prograf as well as prophylactic  Bactrim in addition to other home medications like Lipitor.  She had DIC  panel performed today which was unremarkable.  She also had CT scan of the  abdomen which was performed today which showed no significant abnormalities  except for diffuse mild wall thickening of the jejunum.  Dr. Arrie Aran  kindly asked me to see the patient today for further evaluation of her  pancytopenia and rule out post transplant lymphoproliferative disorder.  When the patient was seen today, she was doing fine.  She denied having any  significant complaints except for diarrhea and weakness.   REVIEW OF SYMPTOMS:  No fever, chills, headache, blurred vision, or double  vision.  No chest pain, shortness of breath, cough, or palpitation.  She  continues to have decreased p.o. intake, had vomiting x 2, no abdominal  pain, constipation, melena, or hematochezia, but she had several episodes of  diarrhea and today, she had three of them.  She denies having any dysuria,  hematuria, urgency, or increased frequency.   PAST MEDICAL HISTORY:  1.  Significant, as mentioned above, for end stage renal disease status post      cadaveric renal transplant in August 2005.  2.  Hypertension.  3.  Anemia.  4.  Degenerative joint disease.  5.  Secondary hyperparathyroidism.   FAMILY HISTORY:  Mother with breast cancer.   SOCIAL HISTORY:  She is married, has two sons, she lives in [place].  She  denies having any history of smoking, alcohol or drug abuse.   ALLERGIES:  She is allergic to penicillin, adhesive tape, and  succinylcholine.   HOME MEDICATIONS:  Prednisone 5 mg p.o. daily, Protonix 40 mg p.o. daily,  Levothyroxine 75 mcg p.o. daily, Cellcept 250 mg p.o. daily, Neutra-Phos 500  mg p.o. b.i.d., Prograf 1.5 mg, magnesium oxide, Restoril, Phenergan, normal  saline infusion.   PHYSICAL EXAMINATION:  VITAL SIGNS:  Blood pressure today 116/80, pulse 92, respiratory rate 20,  temperature 97.5, oxygen sat 99% on room air.  GENERAL:  Exam showed a very pleasant 70 year old white female awake, alert,  in no acute distress.  HEENT:  Normocephalic, atraumatic, clear oropharynx.  NECK:  Supple, no lymphadenopathy.  LUNGS:  Clear to auscultation with no wheezes, crackles, or dullness to  percussion.  HEART:  Normal S1 and S2, regular rate and rhythm, no murmurs, gallops, and   rubs.  ABDOMEN:  Soft, nontender, nondistended, no masses.  EXTREMITIES:  No edema.  LYMPHATIC:  No cervical, supraclavicular, infraclavicular, axillary, or  inguinal lymphadenopathy.  NEUROLOGICAL:  No focal, sensory, or motor deficit.   LABORATORY DATA:  White blood cell count 3.3, hemoglobin 9.6, hematocrit  27.4, MCV 98, platelets 91.  Sodium 136, potassium 2.9, BUN 14, creatinine  0.9, calcium 8, albumin 2.5, alkaline phos 127.  DIC panel showed platelet  count 11, PT 14.3, INR 1.1, PTT 30, fibrinogen 193.   ASSESSMENT AND PLAN:  This is a pleasant 70 year old white female with a  history of end stage renal disease status post cadaveric renal transplant in  August 2005, patient presenting with mild pancytopenia at this point which  is, more likely, to be related to drug induced versus viral induced but, at  the same time, cannot rule out the possibility of post transplant  lymphoproliferative disorder and, because of this possibility, I scheduled  the patient for a bone marrow biopsy and aspirate to be performed on December 01, 2004, and based on the results of the bone marrow biopsy, further  recommendation will be given for management of this patient.  In the  meantime, I recommend the patient to continue on supportive care with  transfusion if needed.  She will be followed for electrolyte abnormalities  and other medical problems by her primary team.  The patient was also  evaluated by gastroenterology for the questionable anemia.   Thank you, so much, for allowing me to participate in the care of Ms.  [name].       MKM/MEDQ  D:  11/29/2004  T:  11/29/2004  Job:  [id number]   cc:   Terrial Rhodes, M.D.  [address]  [place]  [place] [address]  Fax: [phone]

## 2018-01-29 ENCOUNTER — Other Ambulatory Visit: Payer: Self-pay

## 2018-01-29 DIAGNOSIS — T82510A Breakdown (mechanical) of surgically created arteriovenous fistula, initial encounter: Secondary | ICD-10-CM

## 2018-02-06 ENCOUNTER — Inpatient Hospital Stay (HOSPITAL_COMMUNITY): Admission: RE | Admit: 2018-02-06 | Payer: Self-pay | Source: Ambulatory Visit

## 2018-02-06 ENCOUNTER — Encounter: Payer: Self-pay | Admitting: Vascular Surgery

## 2018-02-19 ENCOUNTER — Encounter: Payer: Self-pay | Admitting: Vascular Surgery

## 2018-02-19 ENCOUNTER — Ambulatory Visit (HOSPITAL_COMMUNITY)
Admission: RE | Admit: 2018-02-19 | Discharge: 2018-02-19 | Disposition: A | Discharge status: Home / Self Care | Payer: Medicare Other | Source: Ambulatory Visit | Attending: Vascular Surgery | Admitting: Vascular Surgery

## 2018-02-19 ENCOUNTER — Other Ambulatory Visit: Payer: Self-pay | Admitting: *Deleted

## 2018-02-19 ENCOUNTER — Encounter: Payer: Self-pay | Admitting: *Deleted

## 2018-02-19 ENCOUNTER — Ambulatory Visit (INDEPENDENT_AMBULATORY_CARE_PROVIDER_SITE_OTHER): Payer: Medicare Other | Admitting: Vascular Surgery

## 2018-02-19 ENCOUNTER — Other Ambulatory Visit: Payer: Self-pay

## 2018-02-19 VITALS — BP 143/81 | HR 76 | Temp 97.0°F | Resp 16 | Ht 64.0 in | Wt 88.0 lb

## 2018-02-19 DIAGNOSIS — T82510A Breakdown (mechanical) of surgically created arteriovenous fistula, initial encounter: Secondary | ICD-10-CM | POA: Diagnosis not present

## 2018-02-19 DIAGNOSIS — Y832 Surgical operation with anastomosis, bypass or graft as the cause of abnormal reaction of the patient, or of later complication, without mention of misadventure at the time of the procedure: Secondary | ICD-10-CM | POA: Insufficient documentation

## 2018-02-19 DIAGNOSIS — T82858A Stenosis of vascular prosthetic devices, implants and grafts, initial encounter: Secondary | ICD-10-CM | POA: Insufficient documentation

## 2018-02-19 DIAGNOSIS — N184 Chronic kidney disease, stage 4 (severe): Secondary | ICD-10-CM

## 2018-02-19 NOTE — Progress Notes (Signed)
REASON FOR CONSULT:    Enlarging aneurysm of AV fistula.  The consult is requested by Dr. Marval Regal  HPI:   Gabrielle [name] is a pleasant 77 y.o. 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  I have reviewed the records from the referring office.  GFR was 20 on the most recent lab work.  The patient has multiple other medical comorbidities.  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  This fistula has never been used.  It was placed over 15 years ago.  The fistula has become very aneurysmal and tortuous and appears to be enlarging.  The patient does not really not have any pain associated with the AV fistula.  She denies pain or paresthesias in her left arm.  Past Medical History:  Diagnosis Date  . Anemia   . AVF (arteriovenous fistula) ([facility])   . Chronic renal allograft nephropathy   . Degenerative joint disease   . Diabetes mellitus without complication ([facility])   . EBV (Epstein-Barr virus) viremia   . ESRD (end stage renal disease) ([place])   . FSGS (focal segmental glomerulosclerosis)   . H/O kidney transplant   . Hypertension   . Osteoporosis   . Thyroid disease     No family history on file.  SOCIAL HISTORY: Social History   Socioeconomic History  . Marital status: Single    Spouse name: Not on file  . Number of children: Not on file  . Years of education: Not on file  . Highest education level: Not on file  Occupational History  . Not on file  Social Needs  . Financial resource strain: Not on file  . Food insecurity:    Worry: Not on file    Inability: Not on file  . Transportation needs:    Medical: Not on file    Non-medical: Not on file  Tobacco Use  . Smoking status: Never Smoker  . Smokeless tobacco: Never Used  Substance and Sexual Activity  . Alcohol use: Never    Frequency: Never  . Drug use: Never  . Sexual activity: Not on file  Lifestyle  . Physical activity:    Days per week: Not  on file    Minutes per session: Not on file  . Stress: Not on file  Relationships  . Social connections:    Talks on phone: Not on file    Gets together: Not on file    Attends religious service: Not on file    Active member of club or organization: Not on file    Attends meetings of clubs or organizations: Not on file    Relationship status: Not on file  . Intimate partner violence:    Fear of current or ex partner: Not on file    Emotionally abused: Not on file    Physically abused: Not on file    Forced sexual activity: Not on file  Other Topics Concern  . Not on file  Social History Narrative  . Not on file    Allergies  Allergen Reactions  . Penicillins   . Succinylcholine Chloride     Current Outpatient Medications  Medication Sig Dispense Refill  . amLODipine (NORVASC) 5 MG tablet Take 5 mg by mouth daily.    Mar[place] Kitchen b complex vitamins tablet Take 1 tablet by mouth daily.    . butalbital-aspirin-caffeine-codeine (ASCOMP-CODEINE) 50-325-40-30 MG capsule Take 1 capsule by mouth every 4 (four) hours  as needed for pain.    . Cholecalciferol (VITAMIN D) 2000 units CAPS Take by mouth.    . fesoterodine (TOVIAZ) 4 MG TB24 tablet Take 4 mg by mouth daily.    Marland Kitchen gabapentin (NEURONTIN) 100 MG capsule Take 100 mg by mouth 3 (three) times daily.    Marland Kitchen glimepiride (AMARYL) 4 MG tablet Take 4 mg by mouth daily with breakfast.    . HYDROcodone-acetaminophen (NORCO/VICODIN) 5-325 MG tablet Take 1 tablet by mouth every 6 (six) hours as needed for moderate pain.    Mar[place] Kitchen levothyroxine (SYNTHROID, LEVOTHROID) 75 MCG tablet Take 75 mcg by mouth daily before breakfast.    . mirtazapine (REMERON) 30 MG tablet Take 30 mg by mouth at bedtime.    . MULTIPLE VITAMIN PO Take by mouth.    . pentosan polysulfate (ELMIRON) 100 MG capsule Take 100 mg by mouth 2 (two) times daily.    . predniSONE (DELTASONE) 5 MG tablet Take 5 mg by mouth 1 day or 1 dose.    . Saccharomyces boulardii (PROBIOTIC) 250 MG CAPS  Take by mouth.    . sodium bicarbonate 650 MG tablet Take 650 mg by mouth 2 (two) times daily.    . traZODone (DESYREL) 50 MG tablet Take 50 mg by mouth at bedtime. 1/2 tab    . trimethoprim (TRIMPEX) 100 MG tablet Take 100 mg by mouth 2 (two) times daily.     No current facility-administered medications for this visit.     REVIEW OF SYSTEMS:  [X]  denotes positive finding, [ ]  denotes negative finding Cardiac  Comments:  Chest pain or chest pressure:    Shortness of breath upon exertion:    Short of breath when lying flat:    Irregular heart rhythm:        Vascular    Pain in calf, thigh, or hip brought on by ambulation:    Pain in feet at night that wakes you up from your sleep:     Blood clot in your veins:    Leg swelling:         Pulmonary    Oxygen at home:    Productive cough:     Wheezing:         Neurologic    Sudden weakness in arms or legs:     Sudden numbness in arms or legs:     Sudden onset of difficulty speaking or slurred speech:    Temporary loss of vision in one eye:     Problems with dizziness:         Gastrointestinal    Blood in stool:     Vomited blood:         Genitourinary    Burning when urinating:     Blood in urine:        Psychiatric    Major depression:         Hematologic    Bleeding problems:    Problems with blood clotting too easily:        Skin    Rashes or ulcers:        Constitutional    Fever or chills:     PHYSICAL EXAM:   Vitals:   02/19/18 1032  BP: (!) 143/81  Pulse: 76  Resp: 16  Temp: (!) 97 F (36.1 C)  TempSrc: Oral  SpO2: 98%  Weight: 88 lb (39.9 kg)  Height: 5\' 4"  (1.626 m)    GENERAL: The patient is a well-nourished female, in no  acute distress. The vital signs are documented above. CARDIAC: There is a regular rate and rhythm.  VASCULAR: I do not detect carotid bruits. The patient has a palpable left radial pulse. The upper arm fistula is aneurysmal and markedly tortuous. PULMONARY: There is good  air exchange bilaterally without wheezing or rales. ABDOMEN: Soft and non-tender with normal pitched bowel sounds.  MUSCULOSKELETAL: There are no major deformities or cyanosis. NEUROLOGIC: No focal weakness or paresthesias are detected. SKIN: There are no ulcers or rashes noted. PSYCHIATRIC: The patient has a normal affect.  DATA:    DUPLEX OF AV FISTULA: I have independently interpreted the duplex of the AV fistula.  There is elevated velocities noted where the cephalic vein joins the subclavian vein.  Velocities are up to 682 cm/s.   ASSESSMENT & PLAN:   ANEURYSMAL LEFT UPPER ARM FISTULA: This is somewhat of a complicated situation and that the patient has a very aneurysmal and tortuous left upper arm fistula.  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  Ideally would be best to consider trying to save the fistula however this would require giving her contrast to address the subclavian stenosis and also probably to staged operations to address the tortuosity so that the fistula could even be cannulated.  I have spoken to Dr. Marval Regal today about this and we both agree that perhaps the safest approach would be to simply ligate the fistula and excise the aneurysmal areas.  Given that she is quite frail she is probably not a good candidate for dialysis in the future if things progress.  This has been scheduled for Friday.  I have discussed the indications for the procedure and the potential complications and the patient and her husband are agreeable to proceed.   Deitra Mayo [facility] [phone]

## 2018-02-19 NOTE — H&P (View-Only) (Signed)
REASON FOR CONSULT:    Enlarging aneurysm of AV fistula.  The consult is requested by Dr. Marval Regal  HPI:   Gabrielle [name] is a pleasant 77 y.o. 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  I have reviewed the records from the referring office.  GFR was 20 on the most recent lab work.  The patient has multiple other medical comorbidities.  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  This fistula has never been used.  It was placed over 15 years ago.  The fistula has become very aneurysmal and tortuous and appears to be enlarging.  The patient does not really not have any pain associated with the AV fistula.  She denies pain or paresthesias in her left arm.  Past Medical History:  Diagnosis Date  . Anemia   . AVF (arteriovenous fistula) ([place])   . Chronic renal allograft nephropathy   . Degenerative joint disease   . Diabetes mellitus without complication ([place])   . EBV (Epstein-Barr virus) viremia   . ESRD (end stage renal disease) ([place])   . FSGS (focal segmental glomerulosclerosis)   . H/O kidney transplant   . Hypertension   . Osteoporosis   . Thyroid disease     No family history on file.  SOCIAL HISTORY: Social History   Socioeconomic History  . Marital status: Single    Spouse name: Not on file  . Number of children: Not on file  . Years of education: Not on file  . Highest education level: Not on file  Occupational History  . Not on file  Social Needs  . Financial resource strain: Not on file  . Food insecurity:    Worry: Not on file    Inability: Not on file  . Transportation needs:    Medical: Not on file    Non-medical: Not on file  Tobacco Use  . Smoking status: Never Smoker  . Smokeless tobacco: Never Used  Substance and Sexual Activity  . Alcohol use: Never    Frequency: Never  . Drug use: Never  . Sexual activity: Not on file  Lifestyle  . Physical activity:    Days per week: Not  on file    Minutes per session: Not on file  . Stress: Not on file  Relationships  . Social connections:    Talks on phone: Not on file    Gets together: Not on file    Attends religious service: Not on file    Active member of club or organization: Not on file    Attends meetings of clubs or organizations: Not on file    Relationship status: Not on file  . Intimate partner violence:    Fear of current or ex partner: Not on file    Emotionally abused: Not on file    Physically abused: Not on file    Forced sexual activity: Not on file  Other Topics Concern  . Not on file  Social History Narrative  . Not on file    Allergies  Allergen Reactions  . Penicillins   . Succinylcholine Chloride     Current Outpatient Medications  Medication Sig Dispense Refill  . amLODipine (NORVASC) 5 MG tablet Take 5 mg by mouth daily.    Marland Kitchen b complex vitamins tablet Take 1 tablet by mouth daily.    . butalbital-aspirin-caffeine-codeine (ASCOMP-CODEINE) 50-325-40-30 MG capsule Take 1 capsule by mouth every 4 (four) hours  as needed for pain.    . Cholecalciferol (VITAMIN D) 2000 units CAPS Take by mouth.    . fesoterodine (TOVIAZ) 4 MG TB24 tablet Take 4 mg by mouth daily.    Marland Kitchen gabapentin (NEURONTIN) 100 MG capsule Take 100 mg by mouth 3 (three) times daily.    Marland Kitchen glimepiride (AMARYL) 4 MG tablet Take 4 mg by mouth daily with breakfast.    . HYDROcodone-acetaminophen (NORCO/VICODIN) 5-325 MG tablet Take 1 tablet by mouth every 6 (six) hours as needed for moderate pain.    Marland Kitchen levothyroxine (SYNTHROID, LEVOTHROID) 75 MCG tablet Take 75 mcg by mouth daily before breakfast.    . mirtazapine (REMERON) 30 MG tablet Take 30 mg by mouth at bedtime.    . MULTIPLE VITAMIN PO Take by mouth.    . pentosan polysulfate (ELMIRON) 100 MG capsule Take 100 mg by mouth 2 (two) times daily.    . predniSONE (DELTASONE) 5 MG tablet Take 5 mg by mouth 1 day or 1 dose.    . Saccharomyces boulardii (PROBIOTIC) 250 MG CAPS  Take by mouth.    . sodium bicarbonate 650 MG tablet Take 650 mg by mouth 2 (two) times daily.    . traZODone (DESYREL) 50 MG tablet Take 50 mg by mouth at bedtime. 1/2 tab    . trimethoprim (TRIMPEX) 100 MG tablet Take 100 mg by mouth 2 (two) times daily.     No current facility-administered medications for this visit.     REVIEW OF SYSTEMS:  [X]  denotes positive finding, [ ]  denotes negative finding Cardiac  Comments:  Chest pain or chest pressure:    Shortness of breath upon exertion:    Short of breath when lying flat:    Irregular heart rhythm:        Vascular    Pain in calf, thigh, or hip brought on by ambulation:    Pain in feet at night that wakes you up from your sleep:     Blood clot in your veins:    Leg swelling:         Pulmonary    Oxygen at home:    Productive cough:     Wheezing:         Neurologic    Sudden weakness in arms or legs:     Sudden numbness in arms or legs:     Sudden onset of difficulty speaking or slurred speech:    Temporary loss of vision in one eye:     Problems with dizziness:         Gastrointestinal    Blood in stool:     Vomited blood:         Genitourinary    Burning when urinating:     Blood in urine:        Psychiatric    Major depression:         Hematologic    Bleeding problems:    Problems with blood clotting too easily:        Skin    Rashes or ulcers:        Constitutional    Fever or chills:     PHYSICAL EXAM:   Vitals:   02/19/18 1032  BP: (!) 143/81  Pulse: 76  Resp: 16  Temp: (!) 97 F (36.1 C)  TempSrc: Oral  SpO2: 98%  Weight: 88 lb (39.9 kg)  Height: 5\' 4"  (1.626 m)    GENERAL: The patient is a well-nourished female, in no  acute distress. The vital signs are documented above. CARDIAC: There is a regular rate and rhythm.  VASCULAR: I do not detect carotid bruits. The patient has a palpable left radial pulse. The upper arm fistula is aneurysmal and markedly tortuous. PULMONARY: There is good  air exchange bilaterally without wheezing or rales. ABDOMEN: Soft and non-tender with normal pitched bowel sounds.  MUSCULOSKELETAL: There are no major deformities or cyanosis. NEUROLOGIC: No focal weakness or paresthesias are detected. SKIN: There are no ulcers or rashes noted. PSYCHIATRIC: The patient has a normal affect.  DATA:    DUPLEX OF AV FISTULA: I have independently interpreted the duplex of the AV fistula.  There is elevated velocities noted where the cephalic vein joins the subclavian vein.  Velocities are up to 682 cm/s.   ASSESSMENT & PLAN:   ANEURYSMAL LEFT UPPER ARM FISTULA: This is somewhat of a complicated situation and that the patient has a very aneurysmal and tortuous left upper arm fistula.  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  Ideally would be best to consider trying to save the fistula however this would require giving her contrast to address the subclavian stenosis and also probably to staged operations to address the tortuosity so that the fistula could even be cannulated.  I have spoken to Dr. Marval Regal today about this and we both agree that perhaps the safest approach would be to simply ligate the fistula and excise the aneurysmal areas.  Given that she is quite frail she is probably not a good candidate for dialysis in the future if things progress.  This has been scheduled for Friday.  I have discussed the indications for the procedure and the potential complications and the patient and her husband are agreeable to proceed.   Deitra Mayo [facility] [phone]

## 2018-02-20 ENCOUNTER — Encounter (HOSPITAL_COMMUNITY): Payer: Self-pay | Admitting: *Deleted

## 2018-02-20 ENCOUNTER — Other Ambulatory Visit: Payer: Self-pay

## 2018-02-20 NOTE — Progress Notes (Signed)
Anesthesia Chart Review: SAME DAY WORKUP   Case:  [id number] Date/Time:  02/21/18 0715   Procedure:  LIGATION PLICATION OF ARTERIOVENOUS  FISTULA LEFT ARM (Left )   Anesthesia type:  Choice   Pre-op diagnosis:  fistula complication   Location:  [facility] OR ROOM 11 / [place] [facility]   Surgeon:  Angelia Mould, MD      DISCUSSION: 77 yo female never smoker for above procedure. Pertinent hx includes CKD s/p renal transplant, Chronic UTI on prophylactic bactrim, Gastroparesis, DMII, Hypothyroid, Anemia, HTN, HOH, Tricuspid regurg (Mod 2+ by Echo 2018).  Pt previously seen by cardiology for eval of murmur. Per note in care everywhere dated 09/10/2016  "Her last echo was in 2016 and that revealed grade 2 diastolic dysfunction with 2-3+ tricuspid insufficiency and mild pulmonary hypertension. Her ECG reveals normal sinus rhythm with a rare PAC and a chronic incomplete right bundle branch block and LVH and left axis deviation. She had a recent chest x-ray that was read as COPD but she has not been a smoker and I'm not convinced the chest x-ray clearly shows hyperinflation. She denies any chest discomfort or breathing difficulties. She has no peripheral edema or PND. Her blood pressures under adequate control."  Repeat echo ordered and completed 10/05/2016 showed EF 65-70%, normal wall motion, grade 1 dd, mod TR, mild MR.  Anticipate she can proceed with surgery as planned barring acute status change.  VS: There were no vitals taken for this visit.  PROVIDERS: Wayland Salinas, MD is PCP last seen 12/18/2017  Lamar Blinks, MD is Cardiologist last seen 09/10/2016  LABS: Will need DOS labs  Labs Reviewed - No data to display   IMAGES: XR CHEST AP PORTABLE 06/02/2017  FINDINGS: Lungs are clear. The cardiac and mediastinal contours are normal.   IMPRESSION: No radiographic evidence of acute cardiopulmonary disease.   EKG: 08/17/2016 (outside record, copy on pt chart): Sinus rhythm-occasional PAC.   Incomplete right bundle branch block and left axis anterior fascicular block.  Voltage criteria for LVH-voltage criteria without ST/T abnormality may be normal.  Negative precordial T waves-probably normal-consider anteroseptal ischemia.  CV: TTE 10/05/2016 (outside record, copy on pt chart): Interpretation summary: Complete two-dimensional transthoracic echocardiogram with color flow Doppler and spectral Doppler performed.  Study was technically adequate.  The left ventricle is normal size.  There is moderate concentric left ventricular hypertrophy with normal wall motion and ejection fraction 65 to 70%. Modest diastolic dysfunction; abnormal relaxation pattern.  Left atrium is mildly dilated.  There is mild (1+) mitral regurgitation.  There is moderate (2+) tricuspid regurgitation.  Aortic valve is trileaflet with thin, pliable leaflets that were normal.  TTE 09/11/2014 (care everywhere): Interpretation Summary A complete portable two-dimensional transthoracic echocardiogram was performed. The study was technically adequate. The aortic valve is trileaflet. There is moderate to moderately severe (2-3+) tricuspid regurgitation. The left atrium is moderately dilated. The right atrium is mildly dilated. The left ventricle is normal in size. There is normal left ventricular wall thickness. Proximal septal thickening is noted. Grade II moderate diastolic dysfunction; pseudonormal mitral inflow pattern. The left ventricular ejection fraction is normal (65-70%). The left ventricular wall motion is normal. There are no echo findings consistent with left ventricular outflow obstruction.  Left Ventricle The left ventricle is normal in size. There is normal left ventricular wall thickness. Proximal septal thickening is noted. There are no echo findings consistent with left ventricular outflow obstruction. The left ventricular ejection fraction is normal (65-70%). The left  ventricular wall motion  is normal. Grade II moderate diastolic dysfunction; pseudonormal mitral inflow pattern.   Right Ventricle The right ventricle is grossly normal in size and function.  Atria The left atrium is moderately dilated. The right atrium is mildly dilated. The IVC is normal in size.  Mitral Valve The mitral valve is grossly normal. There is trace mitral regurgitation.   Tricuspid Valve The tricuspid valve is grossly normal. There is moderate to moderately severe (2-3+) tricuspid regurgitation.Right ventricular systolic pressure is elevated between 30-107mm Hg, consistent with mild pulmonary hypertension.  Aortic Valve The aortic valve opens well. The aortic valve is trileaflet. There is no aortic regurgitation present.  Pulmonic Valve The pulmonic valve is not well seen, but is grossly normal. There is moderate to moderately severe (2-3+) pulmonic valvular regurgitation.  Vessels The aortic root is normal in diameter.  Pericardium There is no pericardial effusion.  Past Medical History:  Diagnosis Date  . Anemia   . AVF (arteriovenous fistula) ([place])   . Chronic renal allograft nephropathy   . Degenerative joint disease   . Diabetes mellitus without complication ([place])   . EBV (Epstein-Barr virus) viremia   . ESRD (end stage renal disease) ([place])   . FSGS (focal segmental glomerulosclerosis)   . H/O kidney transplant   . Hypertension   . Osteoporosis   . Thyroid disease     Past Surgical History:  Procedure Laterality Date  . AV FISTULA PLACEMENT    . KIDNEY TRANSPLANT      MEDICATIONS: No current facility-administered medications for this encounter.    Marland Kitchen amLODipine (NORVASC) 5 MG tablet  . b complex vitamins tablet  . butalbital-aspirin-caffeine-codeine (ASCOMP-CODEINE) 50-325-40-30 MG capsule  . Cholecalciferol (VITAMIN D) 2000 units CAPS  . fesoterodine (TOVIAZ) 4 MG TB24 tablet  . gabapentin (NEURONTIN) 100 MG capsule  . glimepiride (AMARYL) 4 MG tablet  .  HYDROcodone-acetaminophen (NORCO/VICODIN) 5-325 MG tablet  . levothyroxine (SYNTHROID, LEVOTHROID) 75 MCG tablet  . mirtazapine (REMERON) 30 MG tablet  . MULTIPLE VITAMIN PO  . pentosan polysulfate (ELMIRON) 100 MG capsule  . predniSONE (DELTASONE) 5 MG tablet  . Saccharomyces boulardii (PROBIOTIC) 250 MG CAPS  . sodium bicarbonate 650 MG tablet  . tacrolimus (PROGRAF) 1 MG capsule  . traZODone (DESYREL) 50 MG tablet  . trimethoprim (TRIMPEX) 100 MG tablet     Wynonia Musty [facility]/Anesthesiology Phone [phone] 02/20/2018 3:51 PM

## 2018-02-20 NOTE — Progress Notes (Signed)
Gabrielle [name]  is severely hard of hearing, "has hearing aids, they don't work, it nerve issues", per husband.  Gabrielle [name] gave me permission to speak to her husband.  Gabrielle [name] would ask Gabrielle [name] any questions that he was not sure of. Gabrielle [name] reports that they were instructed by [facility]VS office to take Amlodipine and Prograf, I added Levothyroxine and Prednisone. Gabrielle [name] has Type II diabetes.  I instructed Gabrielle [name] to not have Gabrielle [name] take Glimepiride in am. I encourged Gabrielle [name] to have Gabrielle [name] eat a hs snack. I instructed Gabrielle [name] to check CBG after awaking and every 2 hours until arrival  to the hospital.  I Instructed Gabrielle [name] if CBG is less than 70 to drink 1/2 cup of a clear juice. Recheck CBG in 15 minutes then call pre- op desk at [phone] for further instructions

## 2018-02-20 NOTE — Anesthesia Preprocedure Evaluation (Addendum)
Anesthesia Evaluation  Patient identified by MRN, date of birth, ID band Patient awake    Reviewed: Allergy & Precautions, NPO status , Patient's Chart, lab work & pertinent test results  Airway Mallampati: I  TM Distance: >3 FB Neck ROM: Full    Dental no notable dental hx. (+) Missing, Dental Advisory Given,    Pulmonary neg pulmonary ROS,    Pulmonary exam normal breath sounds clear to auscultation       Cardiovascular hypertension, Pt. on medications Normal cardiovascular exam Rhythm:Regular Rate:Normal  EKG: 08/17/2016 (outside record, copy on pt chart): Sinus rhythm-occasional PAC.  Incomplete right bundle branch block and left axis anterior fascicular block.  Voltage criteria for LVH-voltage criteria without ST/T abnormality may be normal.  Negative precordial T waves-probably normal-consider anteroseptal ischemia.  TTE 10/05/2016 (outside record, copy on pt chart):  The left ventricle is normal size.  There is moderate concentric left ventricular hypertrophy with normal wall motion and ejection fraction 65 to 70%. Modest diastolic dysfunction; abnormal relaxation pattern.  Left atrium is mildly dilated.  There is mild (1+) mitral regurgitation.  There is moderate (2+) tricuspid regurgitation.  Aortic valve is trileaflet with thin, pliable leaflets that were normal.   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  Reproductive/Obstetrics                           Anesthesia Physical Anesthesia Plan  ASA: III  Anesthesia Plan: MAC   Post-op Pain Management:    Induction:  Intravenous  PONV Risk Score and Plan: 2 and Treatment may vary due to age or medical condition, Dexamethasone and Ondansetron  Airway Management Planned: Natural Airway and Simple Face Mask  Additional Equipment:   Intra-op Plan:   Post-operative Plan:   Informed Consent: I have reviewed the patients History and Physical, chart, labs and discussed the procedure including the risks, benefits and alternatives for the proposed anesthesia with the patient or authorized representative who has indicated his/her understanding and acceptance.   Dental advisory given  Plan Discussed with: CRNA  Anesthesia Plan Comments:        Anesthesia Quick Evaluation

## 2018-02-21 ENCOUNTER — Encounter (HOSPITAL_COMMUNITY): Payer: Self-pay | Admitting: *Deleted

## 2018-02-21 ENCOUNTER — Ambulatory Visit (HOSPITAL_COMMUNITY)
Admission: RE | Admit: 2018-02-21 | Discharge: 2018-02-21 | Disposition: A | Discharge status: Home / Self Care | Payer: Medicare Other | Source: Ambulatory Visit | Attending: Vascular Surgery | Admitting: Vascular Surgery

## 2018-02-21 ENCOUNTER — Encounter (HOSPITAL_COMMUNITY)
Admission: RE | Disposition: A | Discharge status: Home / Self Care | Payer: Self-pay | Source: Ambulatory Visit | Attending: Vascular Surgery

## 2018-02-21 ENCOUNTER — Ambulatory Visit (HOSPITAL_COMMUNITY): Payer: Medicare Other | Admitting: Physician Assistant

## 2018-02-21 DIAGNOSIS — I120 Hypertensive chronic kidney disease with stage 5 chronic kidney disease or end stage renal disease: Secondary | ICD-10-CM | POA: Insufficient documentation

## 2018-02-21 DIAGNOSIS — E039 Hypothyroidism, unspecified: Secondary | ICD-10-CM | POA: Diagnosis not present

## 2018-02-21 DIAGNOSIS — T82898A Other specified complication of vascular prosthetic devices, implants and grafts, initial encounter: Secondary | ICD-10-CM | POA: Insufficient documentation

## 2018-02-21 DIAGNOSIS — E1143 Type 2 diabetes mellitus with diabetic autonomic (poly)neuropathy: Secondary | ICD-10-CM | POA: Insufficient documentation

## 2018-02-21 DIAGNOSIS — Z7984 Long term (current) use of oral hypoglycemic drugs: Secondary | ICD-10-CM | POA: Insufficient documentation

## 2018-02-21 DIAGNOSIS — K3184 Gastroparesis: Secondary | ICD-10-CM | POA: Diagnosis not present

## 2018-02-21 DIAGNOSIS — Z940 Kidney transplant status: Secondary | ICD-10-CM | POA: Diagnosis not present

## 2018-02-21 DIAGNOSIS — Z79890 Hormone replacement therapy (postmenopausal): Secondary | ICD-10-CM | POA: Diagnosis not present

## 2018-02-21 DIAGNOSIS — N186 End stage renal disease: Secondary | ICD-10-CM | POA: Diagnosis not present

## 2018-02-21 DIAGNOSIS — E1122 Type 2 diabetes mellitus with diabetic chronic kidney disease: Secondary | ICD-10-CM | POA: Insufficient documentation

## 2018-02-21 DIAGNOSIS — N185 Chronic kidney disease, stage 5: Secondary | ICD-10-CM

## 2018-02-21 DIAGNOSIS — Z79899 Other long term (current) drug therapy: Secondary | ICD-10-CM | POA: Diagnosis not present

## 2018-02-21 DIAGNOSIS — Y832 Surgical operation with anastomosis, bypass or graft as the cause of abnormal reaction of the patient, or of later complication, without mention of misadventure at the time of the procedure: Secondary | ICD-10-CM | POA: Insufficient documentation

## 2018-02-21 HISTORY — DX: Other complications of anesthesia, initial encounter: T88.59XA

## 2018-02-21 HISTORY — DX: Malignant (primary) neoplasm, unspecified: C80.1

## 2018-02-21 HISTORY — DX: Adverse effect of unspecified anesthetic, initial encounter: T41.45XA

## 2018-02-21 HISTORY — DX: Anxiety disorder, unspecified: F41.9

## 2018-02-21 HISTORY — PX: LIGATION OF ARTERIOVENOUS  FISTULA: SHX5948

## 2018-02-21 HISTORY — DX: Personal history of other medical treatment: Z92.89

## 2018-02-21 LAB — POCT I-STAT 4, (NA,K, GLUC, HGB,HCT)
GLUCOSE: 94 mg/dL (ref 70–99)
HEMATOCRIT: 35 % — AB (ref 36.0–46.0)
HEMOGLOBIN: 11.9 g/dL — AB (ref 12.0–15.0)
Potassium: 4.2 mmol/L (ref 3.5–5.1)
SODIUM: 141 mmol/L (ref 135–145)

## 2018-02-21 LAB — GLUCOSE, CAPILLARY
Glucose-Capillary: 78 mg/dL (ref 70–99)
Glucose-Capillary: 121 mg/dL — ABNORMAL HIGH (ref 70–99)

## 2018-02-21 LAB — HEMOGLOBIN A1C
Hgb A1c MFr Bld: 7.1 % — ABNORMAL HIGH (ref 4.8–5.6)
Mean Plasma Glucose: 157.07 mg/dL

## 2018-02-21 SURGERY — LIGATION OF ARTERIOVENOUS  FISTULA
Anesthesia: Monitor Anesthesia Care | Site: Arm Upper | Laterality: Left

## 2018-02-21 MED ORDER — PROPOFOL 10 MG/ML IV BOLUS
INTRAVENOUS | Status: DC | PRN
  Administered 2018-02-21: 20 mg via INTRAVENOUS

## 2018-02-21 MED ORDER — OXYCODONE HCL 5 MG PO TABS: 5.0000 mg | ORAL_TABLET | ORAL | 0 refills | Status: AC | PRN

## 2018-02-21 MED ORDER — FENTANYL CITRATE (PF) 100 MCG/2ML IJ SOLN
INTRAMUSCULAR | Status: DC | PRN
  Administered 2018-02-21 (×2): 25 ug via INTRAVENOUS

## 2018-02-21 MED ORDER — 0.9 % SODIUM CHLORIDE (POUR BTL) OPTIME
TOPICAL | Status: DC | PRN
  Administered 2018-02-21: 1000 mL

## 2018-02-21 MED ORDER — PROPOFOL 10 MG/ML IV BOLUS
INTRAVENOUS | Status: AC
  Filled 2018-02-21: qty 20

## 2018-02-21 MED ORDER — THROMBIN (RECOMBINANT) 5000 UNITS EX SOLR
CUTANEOUS | Status: AC
  Filled 2018-02-21: qty 20000

## 2018-02-21 MED ORDER — LIDOCAINE HCL (PF) 1 % IJ SOLN
INTRAMUSCULAR | Status: DC | PRN
  Administered 2018-02-21: 26 mL

## 2018-02-21 MED ORDER — ONDANSETRON HCL 4 MG/2ML IJ SOLN
INTRAMUSCULAR | Status: DC | PRN
  Administered 2018-02-21: 4 mg via INTRAVENOUS

## 2018-02-21 MED ORDER — VANCOMYCIN HCL 1000 MG IV SOLR
INTRAVENOUS | Status: DC | PRN
  Administered 2018-02-21: 1000 mg via INTRAVENOUS

## 2018-02-21 MED ORDER — LIDOCAINE HCL (PF) 1 % IJ SOLN
INTRAMUSCULAR | Status: AC
  Filled 2018-02-21: qty 30

## 2018-02-21 MED ORDER — LIDOCAINE-EPINEPHRINE (PF) 1 %-1:200000 IJ SOLN
INTRAMUSCULAR | Status: AC
  Filled 2018-02-21: qty 30

## 2018-02-21 MED ORDER — SODIUM CHLORIDE 0.9 % IV SOLN
INTRAVENOUS | Status: DC
  Administered 2018-02-21: 07:00:00 via INTRAVENOUS

## 2018-02-21 MED ORDER — PROPOFOL 500 MG/50ML IV EMUL
INTRAVENOUS | Status: DC | PRN
  Administered 2018-02-21: 75 ug/kg/min via INTRAVENOUS

## 2018-02-21 MED ORDER — FENTANYL CITRATE (PF) 100 MCG/2ML IJ SOLN: 25.0000 ug | INTRAMUSCULAR | Status: DC | PRN

## 2018-02-21 MED ORDER — FENTANYL CITRATE (PF) 250 MCG/5ML IJ SOLN
INTRAMUSCULAR | Status: AC
  Filled 2018-02-21: qty 5

## 2018-02-21 MED ORDER — SODIUM CHLORIDE 0.9 % IV SOLN
INTRAVENOUS | Status: AC
  Filled 2018-02-21: qty 1.2

## 2018-02-21 MED ORDER — SODIUM CHLORIDE 0.9 % IV SOLN
INTRAVENOUS | Status: DC | PRN
  Administered 2018-02-21: 500 mL

## 2018-02-21 MED ORDER — MIDAZOLAM HCL 2 MG/2ML IJ SOLN
INTRAMUSCULAR | Status: AC
  Filled 2018-02-21: qty 2

## 2018-02-21 MED ORDER — VANCOMYCIN HCL IN DEXTROSE 1-5 GM/200ML-% IV SOLN
1000.0000 mg | INTRAVENOUS | Status: DC
  Filled 2018-02-21: qty 200

## 2018-02-21 MED ORDER — LIDOCAINE HCL (CARDIAC) PF 100 MG/5ML IV SOSY
PREFILLED_SYRINGE | INTRAVENOUS | Status: DC | PRN
  Administered 2018-02-21: 60 mg via INTRAVENOUS

## 2018-02-21 SURGICAL SUPPLY — 35 items
ADH SKN CLS APL DERMABOND .7 (GAUZE/BANDAGES/DRESSINGS) ×1
BANDAGE ELASTIC 4 VELCRO ST LF (GAUZE/BANDAGES/DRESSINGS) ×3 IMPLANT
BNDG GAUZE ELAST 4 BULKY (GAUZE/BANDAGES/DRESSINGS) ×3 IMPLANT
CANISTER SUCT 3000ML PPV (MISCELLANEOUS) ×3 IMPLANT
CANNULA VESSEL 3MM 2 BLNT TIP (CANNULA) IMPLANT
CLIP VESOCCLUDE SM WIDE 6/CT (CLIP) ×3 IMPLANT
DERMABOND ADVANCED (GAUZE/BANDAGES/DRESSINGS) ×2
DERMABOND ADVANCED .7 DNX12 (GAUZE/BANDAGES/DRESSINGS) ×1 IMPLANT
ELECT REM PT RETURN 9FT ADLT (ELECTROSURGICAL) ×3
ELECTRODE REM PT RTRN 9FT ADLT (ELECTROSURGICAL) ×1 IMPLANT
GLOVE BIO SURGEON STRL SZ7.5 (GLOVE) ×6 IMPLANT
GLOVE BIOGEL PI IND STRL 8 (GLOVE) ×1 IMPLANT
GLOVE BIOGEL PI INDICATOR 8 (GLOVE) ×2
GOWN STRL REUS W/ TWL LRG LVL3 (GOWN DISPOSABLE) ×3 IMPLANT
GOWN STRL REUS W/ TWL XL LVL3 (GOWN DISPOSABLE) ×2 IMPLANT
GOWN STRL REUS W/TWL LRG LVL3 (GOWN DISPOSABLE) ×9
GOWN STRL REUS W/TWL XL LVL3 (GOWN DISPOSABLE) ×6
KIT BASIN OR (CUSTOM PROCEDURE TRAY) ×3 IMPLANT
KIT TURNOVER KIT B (KITS) ×3 IMPLANT
LOOP VESSEL MAXI BLUE (MISCELLANEOUS) ×3 IMPLANT
NEEDLE HYPO 25GX1X1/2 BEV (NEEDLE) IMPLANT
NS IRRIG 1000ML POUR BTL (IV SOLUTION) ×3 IMPLANT
PACK CV ACCESS (CUSTOM PROCEDURE TRAY) ×3 IMPLANT
PAD ARMBOARD 7.5X6 YLW CONV (MISCELLANEOUS) ×6 IMPLANT
SPONGE SURGIFOAM ABS GEL 100 (HEMOSTASIS) IMPLANT
SUT ETHILON 3 0 PS 1 (SUTURE) IMPLANT
SUT PROLENE 6 0 BV (SUTURE) IMPLANT
SUT SILK 0 TIES 10X30 (SUTURE) ×3 IMPLANT
SUT VIC AB 3-0 SH 27 (SUTURE) ×9
SUT VIC AB 3-0 SH 27X BRD (SUTURE) ×3 IMPLANT
SUT VIC AB 4-0 PS2 27 (SUTURE) ×3 IMPLANT
SUT VICRYL 4-0 PS2 18IN ABS (SUTURE) ×3 IMPLANT
TOWEL GREEN STERILE (TOWEL DISPOSABLE) ×3 IMPLANT
UNDERPAD 30X30 (UNDERPADS AND DIAPERS) ×3 IMPLANT
WATER STERILE IRR 1000ML POUR (IV SOLUTION) ×3 IMPLANT

## 2018-02-21 NOTE — Interval H&P Note (Signed)
History and Physical Interval Note:  02/21/2018 7:23 AM  Gabrielle [name]  has presented today for surgery, with the diagnosis of fistula complication  The various methods of treatment have been discussed with the patient and family. After consideration of risks, benefits and other options for treatment, the patient has consented to  Procedure(s): LIGATION PLICATION OF ARTERIOVENOUS  FISTULA LEFT ARM (Left) as a surgical intervention .  The patient's history has been reviewed, patient examined, no change in status, stable for surgery.  I have reviewed the patient's chart and labs.  Questions were answered to the patient's satisfaction.     Deitra Mayo

## 2018-02-21 NOTE — Transfer of Care (Signed)
Immediate Anesthesia Transfer of Care Note  Patient: Gabrielle [name]  Procedure(s) Performed: EXCISION OF ANEURYSMAL  ARTERIOVENOUS  FISTULA  LEFT ARM (Left Arm Upper)  Patient Location: PACU  Anesthesia Type:MAC  Level of Consciousness: awake, alert  and oriented  Airway & Oxygen Therapy: Patient Spontanous Breathing  Post-op Assessment: Report given to RN, Post -op Vital signs reviewed and stable and Patient moving all extremities  Post vital signs: Reviewed and stable  Last Vitals:  Vitals Value Taken Time  BP 123/91 02/21/2018  9:22 AM  Temp    Pulse 71 02/21/2018  9:27 AM  Resp 20 02/21/2018  9:27 AM  SpO2 100 % 02/21/2018  9:27 AM  Vitals shown include unvalidated device data.  Last Pain:  Vitals:   02/21/18 0651  PainSc: 0-No pain      Patients Stated Pain Goal: 3 (40/97/35 3299)  Complications: No apparent anesthesia complications

## 2018-02-21 NOTE — Op Note (Signed)
    NAME: Gabrielle [name]    MRN: [id number] DOB: 03-Jan-1941    DATE OF OPERATION: 02/21/2018  PREOP DIAGNOSIS:    Aneurysmal left upper arm fistula  POSTOP DIAGNOSIS:    Same  PROCEDURE:    Excision of large aneurysmal left upper arm fistula  SURGEON: Judeth Cornfield. Scot Dock, MD, FACS  ASSIST: Arlee Muslim, PA  ANESTHESIA: General  EBL: Minimal  INDICATIONS:    Gabrielle [name] is a 77 y.o. female who has a large aneurysmal upper arm fistula which continues to enlarge.  She is not using this as she has a functioning renal transplant.  She presents for excision of this large aneurysmal fistula.  FINDINGS:   Palpable radial pulse at the completion of the procedure.  TECHNIQUE:   The patient was taken to the operating room and sedated by anesthesia.  The left upper extremity was prepped and draped in the usual sterile fashion.  The skin was anesthetized with 1% lidocaine.  An incision was made over the proximal fistula with a fistula here was dissected free up to the level of the anastomosis to the brachial artery.  The vein was ligated adjacent to the brachial artery.  There was a palpable radial pulse at the completion.  A separate longitudinal incision was made in the mid upper arm after the skin was anesthetized.  Here the vein was dissected free also.  Finally, after the skin was anesthetized another incision was made just below the shoulder.  The vein here was dissected free.  The majority of the vein was dissected free and excised after being ligated.  Hemostasis was obtained in the wounds.  Each of the wounds was closed with a deep layer of 3-0 Vicryl to skin closed with 4-0 Vicryl.  Dermabond was applied.  The patient tolerated the procedure well was transferred to the recovery room in stable condition.  All needle and sponge counts were correct.  Deitra Mayo, MD, FACS [facility]  DATE OF DICTATION:   02/21/2018

## 2018-02-21 NOTE — Anesthesia Postprocedure Evaluation (Signed)
Anesthesia Post Note  Patient: Gabrielle [name]  Procedure(s) Performed: EXCISION OF ANEURYSMAL  ARTERIOVENOUS  FISTULA  LEFT ARM (Left Arm Upper)     Patient location during evaluation: PACU Anesthesia Type: MAC Level of consciousness: awake and alert Pain management: pain level controlled Vital Signs Assessment: post-procedure vital signs reviewed and stable Respiratory status: spontaneous breathing, nonlabored ventilation, respiratory function stable and patient connected to nasal cannula oxygen Cardiovascular status: stable and blood pressure returned to baseline Postop Assessment: no apparent nausea or vomiting Anesthetic complications: no    Last Vitals:  Vitals:   02/21/18 0940 02/21/18 0955  BP: (!) 127/95 126/70  Pulse: 65 63  Resp: 17 16  Temp:  (!) 36.3 C  SpO2: 100% 95%    Last Pain:  Vitals:   02/21/18 0955  PainSc: 0-No pain                 Eathel Pajak L Madora Barletta

## 2018-02-21 NOTE — Discharge Instructions (Signed)

## 2018-02-22 ENCOUNTER — Encounter (HOSPITAL_COMMUNITY): Payer: Self-pay | Admitting: Vascular Surgery

## 2019-09-03 DEATH — deceased

## 2019-10-19 ENCOUNTER — Encounter (INDEPENDENT_AMBULATORY_CARE_PROVIDER_SITE_OTHER): Payer: BLUE CROSS/BLUE SHIELD | Admitting: Ophthalmology
